# Patient Record
Sex: Female | Born: 1990 | Race: White | Hispanic: No | Marital: Single | Smoking: Never smoker
Health system: Southern US, Community
[De-identification: ages and names within clinical notes are randomized; demographics above are authoritative.]

## PROBLEM LIST (undated history)

## (undated) DIAGNOSIS — T7840XA Allergy, unspecified, initial encounter: Secondary | ICD-10-CM

## (undated) DIAGNOSIS — F32A Depression, unspecified: Secondary | ICD-10-CM

## (undated) DIAGNOSIS — E669 Obesity, unspecified: Secondary | ICD-10-CM

## (undated) DIAGNOSIS — F419 Anxiety disorder, unspecified: Secondary | ICD-10-CM

## (undated) DIAGNOSIS — J189 Pneumonia, unspecified organism: Secondary | ICD-10-CM

## (undated) DIAGNOSIS — I10 Essential (primary) hypertension: Secondary | ICD-10-CM

## (undated) DIAGNOSIS — K219 Gastro-esophageal reflux disease without esophagitis: Secondary | ICD-10-CM

## (undated) DIAGNOSIS — J45909 Unspecified asthma, uncomplicated: Secondary | ICD-10-CM

## (undated) DIAGNOSIS — F329 Major depressive disorder, single episode, unspecified: Secondary | ICD-10-CM

## (undated) HISTORY — DX: Major depressive disorder, single episode, unspecified: F32.9

## (undated) HISTORY — DX: Essential (primary) hypertension: I10

## (undated) HISTORY — DX: Pneumonia, unspecified organism: J18.9

## (undated) HISTORY — DX: Anxiety disorder, unspecified: F41.9

## (undated) HISTORY — PX: EYE SURGERY: SHX253

## (undated) HISTORY — PX: TONSILECTOMY, ADENOIDECTOMY, BILATERAL MYRINGOTOMY AND TUBES: SHX2538

## (undated) HISTORY — DX: Allergy, unspecified, initial encounter: T78.40XA

## (undated) HISTORY — DX: Gastro-esophageal reflux disease without esophagitis: K21.9

## (undated) HISTORY — PX: BREAST SURGERY: SHX581

## (undated) HISTORY — DX: Depression, unspecified: F32.A

---

## 2015-01-16 ENCOUNTER — Other Ambulatory Visit: Payer: Self-pay | Admitting: Family Medicine

## 2015-01-16 ENCOUNTER — Ambulatory Visit (INDEPENDENT_AMBULATORY_CARE_PROVIDER_SITE_OTHER): Payer: Managed Care, Other (non HMO) | Admitting: Family Medicine

## 2015-01-16 ENCOUNTER — Encounter: Payer: Self-pay | Admitting: Family Medicine

## 2015-01-16 VITALS — BP 120/96 | HR 99 | Temp 98.6°F | Resp 18 | Wt 292.0 lb

## 2015-01-16 DIAGNOSIS — E669 Obesity, unspecified: Secondary | ICD-10-CM

## 2015-01-16 DIAGNOSIS — F329 Major depressive disorder, single episode, unspecified: Secondary | ICD-10-CM

## 2015-01-16 DIAGNOSIS — N938 Other specified abnormal uterine and vaginal bleeding: Secondary | ICD-10-CM

## 2015-01-16 DIAGNOSIS — F32A Depression, unspecified: Secondary | ICD-10-CM

## 2015-01-16 LAB — COMPREHENSIVE METABOLIC PANEL
ALK PHOS: 63 U/L (ref 33–115)
ALT: 33 U/L — AB (ref 6–29)
AST: 24 U/L (ref 10–30)
Albumin: 4.2 g/dL (ref 3.6–5.1)
BILIRUBIN TOTAL: 0.3 mg/dL (ref 0.2–1.2)
BUN: 13 mg/dL (ref 7–25)
CALCIUM: 9.3 mg/dL (ref 8.6–10.2)
CO2: 24 mmol/L (ref 20–31)
Chloride: 104 mmol/L (ref 98–110)
Creat: 0.68 mg/dL (ref 0.50–1.10)
GLUCOSE: 97 mg/dL (ref 65–99)
POTASSIUM: 4.2 mmol/L (ref 3.5–5.3)
Sodium: 141 mmol/L (ref 135–146)
TOTAL PROTEIN: 6.7 g/dL (ref 6.1–8.1)

## 2015-01-16 LAB — CBC
HEMATOCRIT: 34.7 % — AB (ref 36.0–46.0)
HEMOGLOBIN: 11.5 g/dL — AB (ref 12.0–15.0)
MCH: 25.5 pg — ABNORMAL LOW (ref 26.0–34.0)
MCHC: 33.1 g/dL (ref 30.0–36.0)
MCV: 76.9 fL — AB (ref 78.0–100.0)
MPV: 9.8 fL (ref 8.6–12.4)
Platelets: 257 10*3/uL (ref 150–400)
RBC: 4.51 MIL/uL (ref 3.87–5.11)
RDW: 15.2 % (ref 11.5–15.5)
WBC: 6 10*3/uL (ref 4.0–10.5)

## 2015-01-16 LAB — TSH: TSH: 5.076 u[IU]/mL — AB (ref 0.350–4.500)

## 2015-01-16 MED ORDER — FLUOXETINE HCL 20 MG PO TABS: 20.0000 mg | ORAL_TABLET | Freq: Every day | ORAL | Status: DC

## 2015-01-16 MED ORDER — NORETHINDRONE-ETH ESTRADIOL 1-35 MG-MCG PO TABS: 1.0000 | ORAL_TABLET | Freq: Every day | ORAL | Status: DC

## 2015-01-16 NOTE — Progress Notes (Signed)
Subjective:    Patient ID: Julie Fernandez, female    DOB: 10-19-1990, 24 y.o.   MRN: [id number]  HPI This is a 24 yo female who presents today to establish care. She is originally from [place] and moved to [place] from [place] 3 months ago.   She has a 5 year history of depression. She thinks it was triggered by her parent's divorce and her grandmother's death. She dropped out of high school to care for her grandmother and they were very close. She currently lives with her mother and step-father. She reports that they are supportive and they get along well. Her baby brother recently moved to college and she misses him. She was on prozac 20 mg in the past, but has been off for a year. She felt that her depression was significantly improved and she would like to go back on at the same dose. She is currently looking for a job, but has not had any success. She is not sure what she would like to do. She enjoys watching Netflix and playing video games. She has many years of poor sleep. She stays awake until 4 am watching Netflix and playing video games. Does not feel rested when she wakes up. Plays guitar. Does not get any regular exercise. She has felt more depressed lately. Does not like living in [place]. She occasionally thinks about suicide but does not have a plan. She has thought about suicide in the past, but never developed a plan. She would not go through with it because it would be hurtful to her family. She denies homicidal ideation.   Has had very long periods, bleeding for up to 5 months. She was previously on OCP which controlled her cycles. Has never been sexually active. Had pap smear 2 years ago, negative. No pain or cramping. Some days bleeding heavy, 10 pads, other days are lighter. She is interested in going back on same OCP she was on before- ortho novum. Periods were manageable when on OCPs. No history of migraine. Does not smoke.   Diagnosed with asthma as a teenager. Not  currently having any asthma symptoms. Last symptoms 3 months ago. Not on medication.   Past Medical History  Diagnosis Date  . Allergy   . Anxiety   . Depression    Past Surgical History  Procedure Laterality Date  . Breast surgery      breast reduction 2011  . Eye surgery      strabismus   No family history on file. Social History  Substance Use Topics  . Smoking status: Never Smoker   . Smokeless tobacco: Never Used  . Alcohol Use: No   Review of Systems Fatigued, no dizziness, no fever, no cough, rare wheeze. No headaches.     Objective:   Physical Exam Physical Exam  Constitutional: Oriented to person, place, and time. Obese.  HENT:  Head: Normocephalic and atraumatic.  Eyes: Conjunctivae are normal.  Neck: Normal range of motion. Neck supple.  Cardiovascular: Normal rate, regular rhythm and normal heart sounds.   Pulmonary/Chest: Effort normal and breath sounds normal.  Musculoskeletal: Normal range of motion.  Neurological: Alert and oriented to person, place, and time.  Skin: Skin is warm and dry.  Psychiatric: Normal mood and affect. Behavior is normal. Judgment and thought content normal.  Vitals reviewed. BP 120/96 mmHg  Pulse 99  Temp(Src) 98.6 F (37 C) (Oral)  Resp 18  Wt 292 lb (132.45 kg)     Assessment & Plan:  1. Depression - had discussion about non pharmacological steps to decrease depression- importance regular routine, and of getting adequate sleep, good sleep hygiene, healthy food choices, exercise at least every other day. - FLUoxetine (PROZAC) 20 MG tablet; Take 1 tablet (20 mg total) by mouth daily.  Dispense: 30 tablet; Refill: 3 - discussed rare increased depression when starting SSRIs. Instructed patient to go to ER or RTC if she starts to feel that her mood is worse.  2. Dysfunctional uterine bleeding - norethindrone-ethinyl estradiol 1/35 (ORTHO-NOVUM, NORTREL,CYCLAFEM) tablet; Take 1 tablet by mouth daily.  Dispense: 1 Package;  Refill: 11 - CBC - Comprehensive metabolic panel - TSH  3. Obesity - Comprehensive metabolic panel - TSH - Hemoglobin A1c  - follow up in 8 weeks, sooner if she has any worsening depressive symptoms or if bleeding has not stopped in 2 weeks.   Olean Ree, FNP-BC  [facility], [facility]  01/16/2015 6:07 PM

## 2015-01-16 NOTE — Patient Instructions (Signed)
Work on establishing a regular sleep/wake schedule Get some exercise every day If your bleeding doesn't improve in 2-3 weeks, please let me know. If you mood gets worse, please come back in to see me.  I will see you back in 8 weeks for a follow up visit.   Insomnia Insomnia is frequent trouble falling and/or staying asleep. Insomnia can be a long term problem or a short term problem. Both are common. Insomnia can be a short term problem when the wakefulness is related to a certain stress or worry. Long term insomnia is often related to ongoing stress during waking hours and/or poor sleeping habits. Overtime, sleep deprivation itself can make the problem worse. Every little thing feels more severe because you are overtired and your ability to cope is decreased. CAUSES   Stress, anxiety, and depression.  Poor sleeping habits.  Distractions such as TV in the bedroom.  Naps close to bedtime.  Engaging in emotionally charged conversations before bed.  Technical reading before sleep.  Alcohol and other sedatives. They may make the problem worse. They can hurt normal sleep patterns and normal dream activity.  Stimulants such as caffeine for several hours prior to bedtime.  Pain syndromes and shortness of breath can cause insomnia.  Exercise late at night.  Changing time zones may cause sleeping problems (jet lag). It is sometimes helpful to have someone observe your sleeping patterns. They should look for periods of not breathing during the night (sleep apnea). They should also look to see how long those periods last. If you live alone or observers are uncertain, you can also be observed at a sleep clinic where your sleep patterns will be professionally monitored. Sleep apnea requires a checkup and treatment. Give your caregivers your medical history. Give your caregivers observations your family has made about your sleep.  SYMPTOMS   Not feeling rested in the morning.  Anxiety and  restlessness at bedtime.  Difficulty falling and staying asleep. TREATMENT   Your caregiver may prescribe treatment for an underlying medical disorders. Your caregiver can give advice or help if you are using alcohol or other drugs for self-medication. Treatment of underlying problems will usually eliminate insomnia problems.  Medications can be prescribed for short time use. They are generally not recommended for lengthy use.  Over-the-counter sleep medicines are not recommended for lengthy use. They can be habit forming.  You can promote easier sleeping by making lifestyle changes such as:  Using relaxation techniques that help with breathing and reduce muscle tension.  Exercising earlier in the day.  Changing your diet and the time of your last meal. No night time snacks.  Establish a regular time to go to bed.  Counseling can help with stressful problems and worry.  Soothing music and white noise may be helpful if there are background noises you cannot remove.  Stop tedious detailed work at least one hour before bedtime. HOME CARE INSTRUCTIONS   Keep a diary. Inform your caregiver about your progress. This includes any medication side effects. See your caregiver regularly. Take note of:  Times when you are asleep.  Times when you are awake during the night.  The quality of your sleep.  How you feel the next day. This information will help your caregiver care for you.  Get out of bed if you are still awake after 15 minutes. Read or do some quiet activity. Keep the lights down. Wait until you feel sleepy and go back to bed.  Keep regular sleeping  and waking hours. Avoid naps.  Exercise regularly.  Avoid distractions at bedtime. Distractions include watching television or engaging in any intense or detailed activity like attempting to balance the household checkbook.  Develop a bedtime ritual. Keep a familiar routine of bathing, brushing your teeth, climbing into bed  at the same time each night, listening to soothing music. Routines increase the success of falling to sleep faster.  Use relaxation techniques. This can be using breathing and muscle tension release routines. It can also include visualizing peaceful scenes. You can also help control troubling or intruding thoughts by keeping your mind occupied with boring or repetitive thoughts like the old concept of counting sheep. You can make it more creative like imagining planting one beautiful flower after another in your backyard garden.  During your day, work to eliminate stress. When this is not possible use some of the previous suggestions to help reduce the anxiety that accompanies stressful situations. MAKE SURE YOU:   Understand these instructions.  Will watch your condition.  Will get help right away if you are not doing well or get worse. Document Released: 05/03/2000 Document Revised: 07/29/2011 Document Reviewed: 06/03/2007 [facility] Patient Information 2015 [place], [place]. This information is not intended to replace advice given to you by your health care provider. Make sure you discuss any questions you have with your health care provider.

## 2015-01-17 LAB — HEMOGLOBIN A1C
Hgb A1c MFr Bld: 5.4 % (ref <5.7)
MEAN PLASMA GLUCOSE: 108 mg/dL (ref <117)

## 2015-01-18 LAB — T4: T4 TOTAL: 8.7 ug/dL (ref 4.5–12.0)

## 2015-01-25 ENCOUNTER — Encounter: Payer: Self-pay | Admitting: Family Medicine

## 2015-03-13 ENCOUNTER — Ambulatory Visit: Payer: Self-pay | Admitting: Family Medicine

## 2015-03-15 ENCOUNTER — Other Ambulatory Visit: Payer: Self-pay

## 2015-03-15 DIAGNOSIS — F329 Major depressive disorder, single episode, unspecified: Secondary | ICD-10-CM

## 2015-03-15 DIAGNOSIS — F32A Depression, unspecified: Secondary | ICD-10-CM

## 2015-03-15 MED ORDER — FLUOXETINE HCL 20 MG PO TABS: 20.0000 mg | ORAL_TABLET | Freq: Every day | ORAL | Status: DC

## 2015-05-22 ENCOUNTER — Ambulatory Visit: Payer: Managed Care, Other (non HMO) | Admitting: Physician Assistant

## 2015-05-30 ENCOUNTER — Ambulatory Visit: Payer: Managed Care, Other (non HMO) | Admitting: Family Medicine

## 2015-07-12 ENCOUNTER — Ambulatory Visit: Payer: Managed Care, Other (non HMO) | Admitting: Urgent Care

## 2015-08-22 ENCOUNTER — Ambulatory Visit (INDEPENDENT_AMBULATORY_CARE_PROVIDER_SITE_OTHER): Payer: 59 | Admitting: Family Medicine

## 2015-08-22 ENCOUNTER — Encounter: Payer: Self-pay | Admitting: Family Medicine

## 2015-08-22 VITALS — BP 129/90 | HR 120 | Temp 98.7°F | Resp 18 | Ht 63.0 in | Wt 289.0 lb

## 2015-08-22 DIAGNOSIS — F329 Major depressive disorder, single episode, unspecified: Secondary | ICD-10-CM | POA: Diagnosis not present

## 2015-08-22 DIAGNOSIS — J309 Allergic rhinitis, unspecified: Secondary | ICD-10-CM

## 2015-08-22 DIAGNOSIS — K219 Gastro-esophageal reflux disease without esophagitis: Secondary | ICD-10-CM

## 2015-08-22 DIAGNOSIS — F32A Depression, unspecified: Secondary | ICD-10-CM

## 2015-08-22 DIAGNOSIS — J4521 Mild intermittent asthma with (acute) exacerbation: Secondary | ICD-10-CM | POA: Diagnosis not present

## 2015-08-22 DIAGNOSIS — N938 Other specified abnormal uterine and vaginal bleeding: Secondary | ICD-10-CM | POA: Diagnosis not present

## 2015-08-22 MED ORDER — NORETHINDRONE-ETH ESTRADIOL 1-35 MG-MCG PO TABS: 1.0000 | ORAL_TABLET | Freq: Every day | ORAL | Status: DC

## 2015-08-22 MED ORDER — PREDNISONE 20 MG PO TABS: ORAL_TABLET | ORAL | Status: DC

## 2015-08-22 MED ORDER — FLUOXETINE HCL 20 MG PO TABS: 20.0000 mg | ORAL_TABLET | Freq: Every day | ORAL | Status: DC

## 2015-08-22 MED ORDER — OMEPRAZOLE 20 MG PO CPDR: 20.0000 mg | DELAYED_RELEASE_CAPSULE | Freq: Every day | ORAL | Status: DC

## 2015-08-22 MED ORDER — ALBUTEROL SULFATE HFA 108 (90 BASE) MCG/ACT IN AERS: 2.0000 | INHALATION_SPRAY | RESPIRATORY_TRACT | Status: DC | PRN

## 2015-08-22 MED ORDER — BECLOMETHASONE DIPROPIONATE 40 MCG/ACT IN AERS: 2.0000 | INHALATION_SPRAY | Freq: Two times a day (BID) | RESPIRATORY_TRACT | Status: DC

## 2015-08-22 NOTE — Progress Notes (Signed)
Subjective:    Patient ID: Julie Fernandez, female    DOB: 21-May-1990, 25 y.o.   MRN: [id number]  HPI This is a 25 yo female who presents today with 2 months of headache, runny nose, ear pain, sore throat. Felt it moving to her chest and noticed wheezing and dry cough. No fever, no chills.  Has been taking Tylenol Severe Sinus with some improvement. Has problems every spring. Clear, watery nasal drainage. History of asthma, can't find inhaler since she moved. Has been on daily medication in the past with good control.  Has run out of fluoexetine and needs refill of OCPs. Mood good on fluoextine. She is looking for a job. Continues to have difficulty sleeping and has excessive daytime sleeping. Is trying to get on more consistent schedule. Menses much shorter and lighter on OCPs.  She has had recent (1 month) onset of acid indigestion. Temporary relief with Tums, no relief with Zantac 1x/day. Has not noticed relationship to foods. Little caffeine intake.    Past Medical History  Diagnosis Date  . Allergy   . Anxiety   . Depression    Past Surgical History  Procedure Laterality Date  . Breast surgery      breast reduction 2011  . Eye surgery      strabismus  No family history on file. Social History  Substance Use Topics  . Smoking status: Never Smoker   . Smokeless tobacco: Never Used  . Alcohol Use: No    Review of Systems  Constitutional: Positive for fatigue. Negative for fever, chills and diaphoresis.  HENT: Positive for congestion, ear pain, postnasal drip, rhinorrhea and sinus pressure.   Respiratory: Positive for cough, shortness of breath (climbing stairs) and wheezing.   Gastrointestinal: Positive for abdominal pain (burning into chest).  Neurological: Positive for headaches.       Objective:   Physical Exam  Constitutional: She is oriented to person, place, and time. She appears well-developed and well-nourished. No distress.  Morbidly obese  HENT:  Head:  Normocephalic and atraumatic.  Right Ear: External ear normal. Tympanic membrane is retracted.  Left Ear: Tympanic membrane, external ear and ear canal normal.  Nose: Mucosal edema and rhinorrhea present.  Mouth/Throat: Uvula is midline and oropharynx is clear and moist.  Eyes: Conjunctivae are normal.  Neck: Normal range of motion. Neck supple.  Cardiovascular: Normal rate and normal heart sounds.   HR on auscultation 104.  Pulmonary/Chest: Effort normal and breath sounds normal.  Lymphadenopathy:    She has no cervical adenopathy.  Neurological: She is alert and oriented to person, place, and time.  Skin: Skin is warm and dry. She is not diaphoretic.  Psychiatric: She has a normal mood and affect. Her behavior is normal. Judgment and thought content normal.  Vitals reviewed.     BP 129/90 mmHg  Pulse 120  Temp(Src) 98.7 F (37.1 C)  Resp 18  Ht 5\' 3"  (1.6 m)  Wt 289 lb (131.09 kg)  BMI 51.21 kg/m2 Wt Readings from Last 3 Encounters:  08/22/15 289 lb (131.09 kg)  01/16/15 292 lb (132.45 kg)   PF 300, predicted 475    Assessment & Plan:  1. Asthmatic bronchitis, mild intermittent, with acute exacerbation - predniSONE (DELTASONE) 20 MG tablet; Take 3 for 3 days, then 2 for 3 days then 1 for 3 days  Dispense: 18 tablet; Refill: 0 - beclomethasone (QVAR) 40 MCG/ACT inhaler; Inhale 2 puffs into the lungs 2 (two) times daily.  Dispense: 1 Inhaler;  Refill: 12 - albuterol (PROVENTIL HFA;VENTOLIN HFA) 108 (90 Base) MCG/ACT inhaler; Inhale 2 puffs into the lungs every 4 (four) hours as needed for wheezing or shortness of breath (cough, shortness of breath or wheezing.).  Dispense: 1 Inhaler; Refill: 1  2. Allergic rhinitis, unspecified allergic rhinitis type - encouraged her to add daily, long acting antihistamine  3. Gastroesophageal reflux disease, esophagitis presence not specified - Provided written and verbal information regarding diagnosis and treatment. - omeprazole  (PRILOSEC) 20 MG capsule; Take 1 capsule (20 mg total) by mouth daily.  Dispense: 30 capsule; Refill: 1  4. Dysfunctional uterine bleeding - norethindrone-ethinyl estradiol 1/35 (ORTHO-NOVUM, NORTREL,CYCLAFEM) tablet; Take 1 tablet by mouth daily.  Dispense: 1 Package; Refill: 11  5. Depression - encouraged adhering to regular, consistent routine, daily exercise, encouraged her to consider counseling- not interested at this time - FLUoxetine (PROZAC) 20 MG tablet; Take 1 tablet (20 mg total) by mouth daily.  Dispense: 90 tablet; Refill: 1  - f/u in 4-6 weeks, sooner if no improvement with treatment  Olean Ree, FNP-BC  [facility], [facility]  08/22/2015 8:53 AM

## 2015-08-22 NOTE — Patient Instructions (Addendum)
Please start prednisone when you wake up today  For muscle aches, headache, sore throat you can take over the counter acetaminophen or ibuprofen as directed on the package  For nasal congestion you can use Afrin nasal spray twice a day for up to 3 days, and /or sudafed, and/or saline nasal spray  For cough you can use Delsym cough syrup  Please come back to see us or go to the emergency department if you are not better in 5 to 7 days or if you develop fever over 101 for more than 48 hours or if you develop wheezing or shortness of breath.     IF you received an x-ray today, you will receive an invoice from [facility]. Please contact [facility] at [phone] with questions or concerns regarding your invoice.   IF you received labwork today, you will receive an invoice from [facility]. Please contact [facility] at [phone] with questions or concerns regarding your invoice.   Our billing staff will not be able to assist you with questions regarding bills from these companies.  You will be contacted with the lab results as soon as they are available. The fastest way to get your results is to activate your My Chart account. Instructions are located on the last page of this paperwork. If you have not heard from us regarding the results in 2 weeks, please contact this office.    Gastroesophageal Reflux Disease, Adult Normally, food travels down the esophagus and stays in the stomach to be digested. However, when a person has gastroesophageal reflux disease (GERD), food and stomach acid move back up into the esophagus. When this happens, the esophagus becomes sore and inflamed. Over time, GERD can create small holes (ulcers) in the lining of the esophagus.  CAUSES This condition is caused by a problem with the muscle between the esophagus and the stomach (lower esophageal sphincter, or LES). Normally, the LES muscle closes after food passes through  the esophagus to the stomach. When the LES is weakened or abnormal, it does not close properly, and that allows food and stomach acid to go back up into the esophagus. The LES can be weakened by certain dietary substances, medicines, and medical conditions, including:  Tobacco use.  Pregnancy.  Having a hiatal hernia.  Heavy alcohol use.  Certain foods and beverages, such as coffee, chocolate, onions, and peppermint. RISK FACTORS This condition is more likely to develop in:  People who have an increased body weight.  People who have connective tissue disorders.  People who use NSAID medicines. SYMPTOMS Symptoms of this condition include:  Heartburn.  Difficult or painful swallowing.  The feeling of having a lump in the throat.  Abitter taste in the mouth.  Bad breath.  Having a large amount of saliva.  Having an upset or bloated stomach.  Belching.  Chest pain.  Shortness of breath or wheezing.  Ongoing (chronic) cough or a night-time cough.  Wearing away of tooth enamel.  Weight loss. Different conditions can cause chest pain. Make sure to see your health care provider if you experience chest pain. DIAGNOSIS Your health care provider will take a medical history and perform a physical exam. To determine if you have mild or severe GERD, your health care provider may also monitor how you respond to treatment. You may also have other tests, including:  An endoscopy toexamine your stomach and esophagus with a small camera.  A test thatmeasures the acidity level in your esophagus.  A  test thatmeasures how much pressure is on your esophagus.  A barium swallow or modified barium swallow to show the shape, size, and functioning of your esophagus. TREATMENT The goal of treatment is to help relieve your symptoms and to prevent complications. Treatment for this condition may vary depending on how severe your symptoms are. Your health care provider may  recommend:  Changes to your diet.  Medicine.  Surgery. HOME CARE INSTRUCTIONS Diet  Follow a diet as recommended by your health care provider. This may involve avoiding foods and drinks such as:  Coffee and tea (with or without caffeine).  Drinks that containalcohol.  Energy drinks and sports drinks.  Carbonated drinks or sodas.  Chocolate and cocoa.  Peppermint and mint flavorings.  Garlic and onions.  Horseradish.  Spicy and acidic foods, including peppers, chili powder, curry powder, vinegar, hot sauces, and barbecue sauce.  Citrus fruit juices and citrus fruits, such as oranges, lemons, and limes.  Tomato-based foods, such as red sauce, chili, salsa, and pizza with red sauce.  Fried and fatty foods, such as donuts, french fries, potato chips, and high-fat dressings.  High-fat meats, such as hot dogs and fatty cuts of red and white meats, such as rib eye steak, sausage, ham, and bacon.  High-fat dairy items, such as whole milk, butter, and cream cheese.  Eat small, frequent meals instead of large meals.  Avoid drinking large amounts of liquid with your meals.  Avoid eating meals during the 2-3 hours before bedtime.  Avoid lying down right after you eat.  Do not exercise right after you eat. General Instructions  Pay attention to any changes in your symptoms.  Take over-the-counter and prescription medicines only as told by your health care provider. Do not take aspirin, ibuprofen, or other NSAIDs unless your health care provider told you to do so.  Do not use any tobacco products, including cigarettes, chewing tobacco, and e-cigarettes. If you need help quitting, ask your health care provider.  Wear loose-fitting clothing. Do not wear anything tight around your waist that causes pressure on your abdomen.  Raise (elevate) the head of your bed 6 inches (15cm).  Try to reduce your stress, such as with yoga or meditation. If you need help reducing  stress, ask your health care provider.  If you are overweight, reduce your weight to an amount that is healthy for you. Ask your health care provider for guidance about a safe weight loss goal.  Keep all follow-up visits as told by your health care provider. This is important. SEEK MEDICAL CARE IF:  You have new symptoms.  You have unexplained weight loss.  You have difficulty swallowing, or it hurts to swallow.  You have wheezing or a persistent cough.  Your symptoms do not improve with treatment.  You have a hoarse voice. SEEK IMMEDIATE MEDICAL CARE IF:  You have pain in your arms, neck, jaw, teeth, or back.  You feel sweaty, dizzy, or light-headed.  You have chest pain or shortness of breath.  You vomit and your vomit looks like blood or coffee grounds.  You faint.  Your stool is bloody or black.  You cannot swallow, drink, or eat.   This information is not intended to replace advice given to you by your health care provider. Make sure you discuss any questions you have with your health care provider.   Document Released: 02/13/2005 Document Revised: 01/25/2015 Document Reviewed: 08/31/2014 Elsevier Interactive Patient Education [employer]

## 2015-09-12 ENCOUNTER — Emergency Department (HOSPITAL_COMMUNITY): Payer: 59

## 2015-09-12 ENCOUNTER — Encounter (HOSPITAL_COMMUNITY): Payer: Self-pay | Admitting: Emergency Medicine

## 2015-09-12 DIAGNOSIS — Z793 Long term (current) use of hormonal contraceptives: Secondary | ICD-10-CM | POA: Diagnosis not present

## 2015-09-12 DIAGNOSIS — Z7951 Long term (current) use of inhaled steroids: Secondary | ICD-10-CM | POA: Diagnosis not present

## 2015-09-12 DIAGNOSIS — Y92009 Unspecified place in unspecified non-institutional (private) residence as the place of occurrence of the external cause: Secondary | ICD-10-CM | POA: Diagnosis not present

## 2015-09-12 DIAGNOSIS — Y9389 Activity, other specified: Secondary | ICD-10-CM | POA: Insufficient documentation

## 2015-09-12 DIAGNOSIS — Z79899 Other long term (current) drug therapy: Secondary | ICD-10-CM | POA: Diagnosis not present

## 2015-09-12 DIAGNOSIS — Y998 Other external cause status: Secondary | ICD-10-CM | POA: Insufficient documentation

## 2015-09-12 DIAGNOSIS — F419 Anxiety disorder, unspecified: Secondary | ICD-10-CM | POA: Insufficient documentation

## 2015-09-12 DIAGNOSIS — S99912A Unspecified injury of left ankle, initial encounter: Secondary | ICD-10-CM | POA: Insufficient documentation

## 2015-09-12 DIAGNOSIS — E669 Obesity, unspecified: Secondary | ICD-10-CM | POA: Diagnosis not present

## 2015-09-12 DIAGNOSIS — W108XXA Fall (on) (from) other stairs and steps, initial encounter: Secondary | ICD-10-CM | POA: Insufficient documentation

## 2015-09-12 DIAGNOSIS — F329 Major depressive disorder, single episode, unspecified: Secondary | ICD-10-CM | POA: Insufficient documentation

## 2015-09-12 NOTE — ED Notes (Signed)
Pt. slipped and fell at home this evening , reports pain at left ankle with mild swelling .

## 2015-09-13 ENCOUNTER — Emergency Department (HOSPITAL_COMMUNITY)
Admission: EM | Admit: 2015-09-13 | Discharge: 2015-09-13 | Disposition: A | Discharge status: Home / Self Care | Payer: 59 | Attending: Emergency Medicine | Admitting: Emergency Medicine

## 2015-09-13 DIAGNOSIS — M25572 Pain in left ankle and joints of left foot: Secondary | ICD-10-CM

## 2015-09-13 DIAGNOSIS — W19XXXA Unspecified fall, initial encounter: Secondary | ICD-10-CM

## 2015-09-13 HISTORY — DX: Obesity, unspecified: E66.9

## 2015-09-13 MED ORDER — IBUPROFEN 400 MG PO TABS
800.0000 mg | ORAL_TABLET | Freq: Once | ORAL | Status: AC
  Administered 2015-09-13: 800 mg via ORAL
  Filled 2015-09-13: qty 2

## 2015-09-13 MED ORDER — IBUPROFEN 800 MG PO TABS: 800.0000 mg | ORAL_TABLET | Freq: Three times a day (TID) | ORAL | Status: DC

## 2015-09-13 NOTE — ED Notes (Signed)
Patient able to ambulate independently  

## 2015-09-13 NOTE — ED Provider Notes (Signed)
CSN: [id number]     Arrival date & time 09/12/15  2306 History   First MD Initiated Contact with Patient 09/13/15 0021     Chief Complaint  Patient presents with  . Ankle Injury     (Consider location/radiation/quality/duration/timing/severity/associated sxs/prior Treatment) HPI   Patient is a 25 year old female with past medical history of anxiety, depression and obesity presents to the ED status post fall that occurred prior to arrival with complaint of left ankle pain and swelling. Patient reports she slipped and fell down stairs at home resulting in her landing on her left ankle. Patient denies head injury or LOC. Patient denies taking any medications or using ice prior to arrival. Denies numbness, tingling, weakness.  Past Medical History  Diagnosis Date  . Allergy   . Anxiety   . Depression   . Obesity    Past Surgical History  Procedure Laterality Date  . Breast surgery      breast reduction 2011  . Eye surgery      strabismus   No family history on file. Social History  Substance Use Topics  . Smoking status: Never Smoker   . Smokeless tobacco: Never Used  . Alcohol Use: No   OB History    No data available     Review of Systems  Constitutional: Negative for fever.  Musculoskeletal: Positive for joint swelling (left ankle) and arthralgias (left ankle).  Skin: Negative for wound.  Neurological: Negative for numbness.      Allergies  Review of patient's allergies indicates no known allergies.  Home Medications   Prior to Admission medications   Medication Sig Start Date End Date Taking? Authorizing Provider  albuterol (PROVENTIL HFA;VENTOLIN HFA) 108 (90 Base) MCG/ACT inhaler Inhale 2 puffs into the lungs every 4 (four) hours as needed for wheezing or shortness of breath (cough, shortness of breath or wheezing.). 08/22/15   Emi Belfasteborah B Gessner, FNP  beclomethasone (QVAR) 40 MCG/ACT inhaler Inhale 2 puffs into the lungs 2 (two) times daily. 08/22/15   Emi Belfasteborah B  Gessner, FNP  FLUoxetine (PROZAC) 20 MG tablet Take 1 tablet (20 mg total) by mouth daily. 08/22/15   Emi Belfasteborah B Gessner, FNP  ibuprofen (ADVIL,MOTRIN) 800 MG tablet Take 1 tablet (800 mg total) by mouth 3 (three) times daily. 09/13/15   Barrett HenleNicole Elizabeth Nadeau, PA-C  norethindrone-ethinyl estradiol 1/35 (ORTHO-NOVUM, NORTREL,CYCLAFEM) tablet Take 1 tablet by mouth daily. 08/22/15   Emi Belfasteborah B Gessner, FNP  omeprazole (PRILOSEC) 20 MG capsule Take 1 capsule (20 mg total) by mouth daily. 08/22/15   Emi Belfasteborah B Gessner, FNP  predniSONE (DELTASONE) 20 MG tablet Take 3 for 3 days, then 2 for 3 days then 1 for 3 days 08/22/15   Emi Belfasteborah B Gessner, FNP   BP 132/93 mmHg  Pulse 90  Temp(Src) 98.6 F (37 C) (Oral)  Resp 18  Ht 5\' 3"  (1.6 m)  Wt 133.358 kg  BMI 52.09 kg/m2  SpO2 98% Physical Exam  Constitutional: She is oriented to person, place, and time. She appears well-developed and well-nourished.  HENT:  Head: Normocephalic and atraumatic.  Eyes: Conjunctivae and EOM are normal. Right eye exhibits no discharge. Left eye exhibits no discharge. No scleral icterus.  Neck: Normal range of motion. Neck supple.  Pulmonary/Chest: Effort normal.  Musculoskeletal:       Left ankle: She exhibits swelling. She exhibits normal range of motion, no ecchymosis, no deformity, no laceration and normal pulse. Tenderness. Lateral malleolus tenderness found. Achilles tendon normal.  Moderate swelling noted to  left lateral malleolus. Full range of motion of left foot ankle and knee. 5 out of 5 strength. Sensation grossly intact. 2+ DP pulses. Cap refill less than 2.  Neurological: She is alert and oriented to person, place, and time.  Nursing note and vitals reviewed.   ED Course  Procedures (including critical care time) Labs Review Labs Reviewed - No data to display  Imaging Review Dg Ankle Complete Left  09/13/2015  CLINICAL DATA:  Left ankle pain and swelling after fall down 6 steps. Pain and swelling laterally.  EXAM: LEFT ANKLE COMPLETE - 3+ VIEW COMPARISON:  None. FINDINGS: No fracture or dislocation. The alignment and joint spaces are maintained. The ankle mortise is preserved. Base of the fifth metatarsal is intact. No evidence joint effusion. Mild soft tissue edema laterally. IMPRESSION: Soft tissue edema.  No fracture or dislocation. Electronically Signed   By: Rubye Oaks M.D.   On: 09/13/2015 00:04   I have personally reviewed and evaluated these images and lab results as part of my medical decision-making.   EKG Interpretation None      MDM   Final diagnoses:  Fall, initial encounter  Left ankle pain    Patient presents status post mechanical fall that occurred prior to arrival with reported left ankle pain and swelling. Denies head injury or LOC. HR 112, BP 150/116, remaining vital stable. Exam revealed moderate swelling noted to left lateral malleolus with tenderness to palpation, range of motion of left foot ankle and knee, left lower extremity otherwise neurovascularly intact. Patient given ibuprofen in the ED. Left ankle x-ray revealed soft tissue edema, no fracture or dislocation. Splint applied to patient's ankle in the ED. Patient refuses crutches in the ED and states she has crutches at home. Reevaluation of patient's vitals revealed heart rate 90, BP 132/93. Discussed results and plan for discharge with patient. Patient discharged home with NSAIDs, RICE protocol and symptomatic treatment. Patient given follow-up information for orthopedics and advised to follow-up as needed.    Satira Sark [place], [place] 09/13/15 0151  Derwood Kaplan, MD 09/14/15 [phone]

## 2015-09-13 NOTE — Discharge Instructions (Signed)
Take your medications as prescribed as needed for pain relief. I recommend eating prior to taking ibuprofen to prevent gastrointestinal side effects. I recommend resting, elevating and applying ice to her left ankle for 15-20 minutes 3-4 times daily to help with pain and swelling. You may continue using the Ace wrap as needed for pain relief and to help with swelling. Follow up with the orthopedic office listed above her symptoms have not improved over the next 1-2 weeks. Please return to the Emergency Department if symptoms worsen or new onset of fever, redness, swelling, drainage, numbness, tingling, weakness.

## 2015-10-22 ENCOUNTER — Other Ambulatory Visit: Payer: Self-pay | Admitting: Family Medicine

## 2015-12-29 ENCOUNTER — Ambulatory Visit: Payer: 59

## 2016-02-13 ENCOUNTER — Ambulatory Visit (INDEPENDENT_AMBULATORY_CARE_PROVIDER_SITE_OTHER): Payer: 59 | Admitting: Physician Assistant

## 2016-02-13 VITALS — BP 128/106 | HR 116 | Temp 98.6°F | Resp 18 | Ht 63.0 in | Wt 283.0 lb

## 2016-02-13 DIAGNOSIS — J4530 Mild persistent asthma, uncomplicated: Secondary | ICD-10-CM

## 2016-02-13 DIAGNOSIS — R05 Cough: Secondary | ICD-10-CM | POA: Diagnosis not present

## 2016-02-13 DIAGNOSIS — Z91048 Other nonmedicinal substance allergy status: Secondary | ICD-10-CM

## 2016-02-13 DIAGNOSIS — IMO0001 Reserved for inherently not codable concepts without codable children: Secondary | ICD-10-CM

## 2016-02-13 DIAGNOSIS — R030 Elevated blood-pressure reading, without diagnosis of hypertension: Secondary | ICD-10-CM

## 2016-02-13 DIAGNOSIS — R059 Cough, unspecified: Secondary | ICD-10-CM

## 2016-02-13 DIAGNOSIS — J0181 Other acute recurrent sinusitis: Secondary | ICD-10-CM | POA: Diagnosis not present

## 2016-02-13 DIAGNOSIS — J45909 Unspecified asthma, uncomplicated: Secondary | ICD-10-CM | POA: Insufficient documentation

## 2016-02-13 DIAGNOSIS — Z9109 Other allergy status, other than to drugs and biological substances: Secondary | ICD-10-CM | POA: Insufficient documentation

## 2016-02-13 MED ORDER — GUAIFENESIN ER 1200 MG PO TB12: 1.0000 | ORAL_TABLET | Freq: Two times a day (BID) | ORAL | 1 refills | Status: DC | PRN

## 2016-02-13 MED ORDER — FLUTICASONE PROPIONATE 50 MCG/ACT NA SUSP: 2.0000 | Freq: Every day | NASAL | 12 refills | Status: DC

## 2016-02-13 MED ORDER — AMOXICILLIN 500 MG PO CAPS
500.0000 mg | ORAL_CAPSULE | Freq: Two times a day (BID) | ORAL | 0 refills | Status: DC
Start: 2016-02-13 — End: 2016-08-06

## 2016-02-13 NOTE — Progress Notes (Signed)
Julie Fernandez  MRN: [id number] DOB: [birth year]  PCP: Emi Belfasteborah B Gessner, FNP  Subjective:  Pt is a [age] year old female, history of depression, obesity, asthma and allergies, who presents to clinic for sinus pressure. Reports being congested with sinus pressure, headaches, ear pain (both ears), sneezing, and her "lymph nodes are hurting" x three weeks. Notes decreased hearing in both ears for the past week.  She has allergies daily. Tried mucinex two weeks ago, didn't help.  Notes fevers, chills earlier this week. Reports history of sinus infections   Asthma - QVAR and Albuterol. QVAR 2 puffs/day. Albuterol every few months.  Diagnosed with Asthmatic bronchitis in March 2017. [facility] with [facility].   Allergies - Singular daily. Not helping. Has seen allergist when she was 25 years old. Is allergic to cats, dogs, mold and behaya grass.   Lives in house with mold problem in basement.  Nasal steroids don't work. Used them when she was 25 years old living in [place].  Doesn't like putting medications up her nose.   Review of Systems  Constitutional: Positive for chills and fever.  HENT: Positive for congestion, ear pain, hearing loss, postnasal drip, rhinorrhea, sinus pressure and sneezing.   Respiratory: Positive for cough. Negative for chest tightness, shortness of breath and wheezing.   Cardiovascular: Negative.   Gastrointestinal: Negative.     There are no active problems to display for this patient.   Current Outpatient Prescriptions on File Prior to Visit  Medication Sig Dispense Refill  . albuterol (PROVENTIL HFA;VENTOLIN HFA) 108 (90 Base) MCG/ACT inhaler Inhale 2 puffs into the lungs every 4 (four) hours as needed for wheezing or shortness of breath (cough, shortness of breath or wheezing.). 1 Inhaler 1  . beclomethasone (QVAR) 40 MCG/ACT inhaler Inhale 2 puffs into the lungs 2 (two) times daily. 1 Inhaler 12  . FLUoxetine (PROZAC) 20 MG tablet Take 1 tablet (20 mg  total) by mouth daily. 90 tablet 1  . ibuprofen (ADVIL,MOTRIN) 800 MG tablet Take 1 tablet (800 mg total) by mouth 3 (three) times daily. 21 tablet 0  . norethindrone-ethinyl estradiol 1/35 (ORTHO-NOVUM, NORTREL,CYCLAFEM) tablet Take 1 tablet by mouth daily. 1 Package 11  . omeprazole (PRILOSEC) 20 MG capsule TAKE 1 CAPSULE (20 MG TOTAL) BY MOUTH DAILY. 30 capsule 0   No current facility-administered medications on file prior to visit.     No Known Allergies  Objective:  BP (!) 128/106 (BP Location: Right Arm, Patient Position: Sitting, Cuff Size: Large)   Pulse (!) 116   Temp 98.6 F (37 C) (Oral)   Resp 18   Ht 5\' 3"  (1.6 m)   Wt 283 lb (128.4 kg)   LMP 02/06/2016   SpO2 98%   BMI 50.13 kg/m   Physical Exam  Constitutional: She is oriented to person, place, and time and well-developed, well-nourished, and in no distress. No distress.  HENT:  Head: Normocephalic and atraumatic.  Right Ear: Hearing and ear canal normal. No tenderness. Tympanic membrane is retracted. Tympanic membrane is not injected, not erythematous and not bulging. No middle ear effusion.  Left Ear: Hearing and ear canal normal. No tenderness. Tympanic membrane is retracted. Tympanic membrane is not injected, not erythematous and not bulging.  No middle ear effusion.  Nose: Mucosal edema present. No rhinorrhea. Right sinus exhibits maxillary sinus tenderness. Right sinus exhibits no frontal sinus tenderness. Left sinus exhibits maxillary sinus tenderness. Left sinus exhibits no frontal sinus tenderness.  Mouth/Throat: Uvula is midline  and mucous membranes are normal. Posterior oropharyngeal edema and posterior oropharyngeal erythema present. No oropharyngeal exudate.  Eyes: Conjunctivae are normal. Pupils are equal, round, and reactive to light.  Cardiovascular: Normal rate, regular rhythm and normal heart sounds.   Neurological: She is alert and oriented to person, place, and time. GCS score is 15.  Skin: Skin is  warm and dry.  Psychiatric: Mood, memory, affect and judgment normal.  Vitals reviewed.   Assessment and Plan :  1. Asthma, chronic, mild persistent, uncomplicated 2. Environmental allergies 3. Other acute recurrent sinusitis 4. Cough - fluticasone (FLONASE) 50 MCG/ACT nasal spray; Place 2 sprays into both nostrils daily.  Dispense: 16 g; Refill: 12 - amoxicillin (AMOXIL) 500 MG capsule; Take 1 capsule (500 mg total) by mouth 2 (two) times daily.  Dispense: 20 capsule; Refill: 0 - Guaifenesin (MUCINEX MAXIMUM STRENGTH) 1200 MG TB12; Take 1 tablet (1,200 mg total) by mouth every 12 (twelve) hours as needed.  Dispense: 14 tablet; Refill: 1 - Patient encouraged to keep regular daily regimen of allergy medication.  5. Elevated Blood Pressure - Patient encouraged to return to clinic for blood pressure check. Her mother checks her blood pressure at home. She is encouraged to log her readings and bring them with her.   Marco Collie, PA-C  [facility] 02/13/2016 3:44 PM

## 2016-02-13 NOTE — Patient Instructions (Addendum)
[email or web address] to get a coupon.  Please come back in one month to assess your blood pressure. Please bring in a log of your home readings.  Thank you for coming in today. I hope you feel we met your needs.  Feel free to call [facility] if you have any questions or further requests.  Please consider signing up for MyChart if you do not already have it, as this is a great way to communicate with me.  Best,  Whitney McVey, PA-C  IF you received an x-ray today, you will receive an invoice from [facility]. Please contact [facility] at [phone] with questions or concerns regarding your invoice.   IF you received labwork today, you will receive an invoice from [employer]. Please contact [facility] at [phone] with questions or concerns regarding your invoice.   Our billing staff will not be able to assist you with questions regarding bills from these companies.  You will be contacted with the lab results as soon as they are available. The fastest way to get your results is to activate your My Chart account. Instructions are located on the last page of this paperwork. If you have not heard from [place] regarding the results in 2 weeks, please contact this office.

## 2016-02-29 ENCOUNTER — Telehealth: Payer: Self-pay

## 2016-02-29 DIAGNOSIS — N938 Other specified abnormal uterine and vaginal bleeding: Secondary | ICD-10-CM

## 2016-02-29 MED ORDER — NORETHINDRONE-ETH ESTRADIOL 1-35 MG-MCG PO TABS: 1.0000 | ORAL_TABLET | Freq: Every day | ORAL | 4 refills | Status: DC

## 2016-02-29 NOTE — Telephone Encounter (Signed)
rx was written for a year so I changed to 3 months at a time for a year.

## 2016-02-29 NOTE — Telephone Encounter (Signed)
THIS MESSAGE WAS RECEIVED FROM [facility] ON [address]: THEY ARE REQUESTING PATIENT PRESCRIPTION FOR ALYACEN WRITTEN BY DEBBIE GESSNER ON April. 4, 2017 BE CHANGED FROM 30 DAYS TO 90 DAYS (3 MONTH SUPPLY). [facility] PHONE ([place]) [phone]) [phone].  MBC

## 2016-03-13 ENCOUNTER — Other Ambulatory Visit: Payer: Self-pay

## 2016-03-13 MED ORDER — FLUOXETINE HCL 20 MG PO TABS: 20.0000 mg | ORAL_TABLET | Freq: Every day | ORAL | 0 refills | Status: DC

## 2016-03-13 NOTE — Telephone Encounter (Signed)
Last refill and last OV 08/22/15. Ok to refill?

## 2016-04-15 ENCOUNTER — Other Ambulatory Visit: Payer: Self-pay | Admitting: Family Medicine

## 2016-04-23 ENCOUNTER — Other Ambulatory Visit: Payer: Self-pay

## 2016-04-23 MED ORDER — OMEPRAZOLE 20 MG PO CPDR: DELAYED_RELEASE_CAPSULE | ORAL | 0 refills | Status: DC

## 2016-04-23 MED ORDER — FLUOXETINE HCL 20 MG PO TABS: 20.0000 mg | ORAL_TABLET | Freq: Every day | ORAL | 0 refills | Status: DC

## 2016-05-31 ENCOUNTER — Other Ambulatory Visit: Payer: Self-pay | Admitting: Family Medicine

## 2016-06-20 ENCOUNTER — Other Ambulatory Visit: Payer: Self-pay | Admitting: Family Medicine

## 2016-06-22 NOTE — Telephone Encounter (Signed)
Last ov 08/2015 needs ov

## 2016-06-28 ENCOUNTER — Ambulatory Visit: Payer: 59

## 2016-07-02 ENCOUNTER — Ambulatory Visit: Payer: 59

## 2016-08-06 ENCOUNTER — Other Ambulatory Visit: Payer: Self-pay | Admitting: Physician Assistant

## 2016-08-06 ENCOUNTER — Ambulatory Visit (INDEPENDENT_AMBULATORY_CARE_PROVIDER_SITE_OTHER): Payer: 59 | Admitting: Physician Assistant

## 2016-08-06 VITALS — BP 104/60 | HR 102 | Temp 98.2°F | Resp 16 | Ht 63.0 in | Wt 289.0 lb

## 2016-08-06 DIAGNOSIS — J0181 Other acute recurrent sinusitis: Secondary | ICD-10-CM

## 2016-08-06 DIAGNOSIS — R059 Cough, unspecified: Secondary | ICD-10-CM

## 2016-08-06 DIAGNOSIS — F339 Major depressive disorder, recurrent, unspecified: Secondary | ICD-10-CM

## 2016-08-06 DIAGNOSIS — R05 Cough: Secondary | ICD-10-CM | POA: Diagnosis not present

## 2016-08-06 MED ORDER — AMOXICILLIN 500 MG PO CAPS: 500.0000 mg | ORAL_CAPSULE | Freq: Two times a day (BID) | ORAL | 0 refills | Status: DC

## 2016-08-06 MED ORDER — GUAIFENESIN ER 1200 MG PO TB12: 1.0000 | ORAL_TABLET | Freq: Two times a day (BID) | ORAL | 1 refills | Status: DC | PRN

## 2016-08-06 MED ORDER — FLUOXETINE HCL 40 MG PO CAPS: 40.0000 mg | ORAL_CAPSULE | Freq: Every day | ORAL | 3 refills | Status: DC

## 2016-08-06 NOTE — Progress Notes (Signed)
Julie MessingCourtney Phaneuf  MRN: [id number] DOB: 1990/12/28  PCP: Madelaine BhatElizabeth Whitney Yoland Scherr, PA-C  Subjective:  Pt is a 26 year old female PMH asthma and allergies who presents to clinic for sinus pressure x 3 months.  Endorses runny nose, sinus pain and pressure, ear pain.  She has been taking flonase, OTC meds - not helping.  She is not using her Albuterol.  Denies fever, chills, n/v, sore throat, body aches.    H/o Depression - she feels her prozac 20mg  is not working as well as it was. She has been on this medication x 2 years. Denies SI and HI    Review of Systems  Constitutional: Positive for chills. Negative for diaphoresis, fatigue and fever.  HENT: Positive for congestion, ear pain, postnasal drip, sinus pain and sinus pressure. Negative for ear discharge, rhinorrhea, sneezing and sore throat.   Respiratory: Negative for cough, chest tightness, shortness of breath and wheezing.   Cardiovascular: Negative for chest pain and palpitations.  Gastrointestinal: Negative for abdominal pain, diarrhea, nausea and vomiting.  Neurological: Negative for weakness, light-headedness and headaches.  Psychiatric/Behavioral: Positive for dysphoric mood and sleep disturbance.    Patient Active Problem List   Diagnosis Date Noted  . Asthma, chronic 02/13/2016  . Environmental allergies 02/13/2016    Current Outpatient Prescriptions on File Prior to Visit  Medication Sig Dispense Refill  . albuterol (PROVENTIL HFA;VENTOLIN HFA) 108 (90 Base) MCG/ACT inhaler Inhale 2 puffs into the lungs every 4 (four) hours as needed for wheezing or shortness of breath (cough, shortness of breath or wheezing.). 1 Inhaler 1  . beclomethasone (QVAR) 40 MCG/ACT inhaler Inhale 2 puffs into the lungs 2 (two) times daily. 1 Inhaler 12  . FLUoxetine (PROZAC) 20 MG tablet TAKE 1 TABLET (20 MG TOTAL) BY MOUTH DAILY. 30 tablet 0  . fluticasone (FLONASE) 50 MCG/ACT nasal spray Place 2 sprays into both nostrils daily. 16 g 12  .  norethindrone-ethinyl estradiol 1/35 (ORTHO-NOVUM, NORTREL,CYCLAFEM) tablet Take 1 tablet by mouth daily. 3 Package 4  . omeprazole (PRILOSEC) 20 MG capsule TAKE 1 CAPSULE (20 MG TOTAL) BY MOUTH DAILY. 30 capsule 0   No current facility-administered medications on file prior to visit.     No Known Allergies   Objective:  BP 104/60   Pulse (!) 102   Temp 98.2 F (36.8 C) (Other (Comment))   Resp 16   Ht 5\' 3"  (1.6 m)   Wt 289 lb (131.1 kg)   SpO2 98%   BMI 51.19 kg/m   Physical Exam  Constitutional: She is oriented to person, place, and time and well-developed, well-nourished, and in no distress. No distress.  obese  HENT:  Right Ear: Tympanic membrane normal.  Left Ear: Tympanic membrane normal.  Nose: Right sinus exhibits maxillary sinus tenderness. Right sinus exhibits no frontal sinus tenderness. Left sinus exhibits maxillary sinus tenderness. Left sinus exhibits no frontal sinus tenderness.  Mouth/Throat: Oropharynx is clear and moist and mucous membranes are normal.  Cardiovascular: Normal rate, regular rhythm and normal heart sounds.   Neurological: She is alert and oriented to person, place, and time. GCS score is 15.  Skin: Skin is warm and dry.  Psychiatric: Mood, memory and judgment normal. She has a flat affect.  Vitals reviewed.   Assessment and Plan :  1. Other acute recurrent sinusitis 2. Cough - amoxicillin (AMOXIL) 500 MG capsule; Take 1 capsule (500 mg total) by mouth 2 (two) times daily.  Dispense: 20 capsule; Refill: 0 - Guaifenesin (MUCINEX  MAXIMUM STRENGTH) 1200 MG TB12; Take 1 tablet (1,200 mg total) by mouth every 12 (twelve) hours as needed.  Dispense: 14 tablet; Refill: 1 - Supportive care: push fluids and rest. RTC in 5-7 days if no improvement. Consider referral to ENT if symptoms persist.  3. Recurrent major depressive disorder, remission status unspecified (HCC) - FLUoxetine (PROZAC) 40 MG capsule; Take 1 capsule (40 mg total) by mouth daily.   Dispense: 90 capsule; Refill: 3 - Pt is on lowest dose Prozac. Will increase to to 40mg  qd. Encouraged exercise and increased activity in personal interests. RTC in 4-6 weeks for f/u.   Marco Collie, PA-C  [facility] 08/06/2016 4:08 PM

## 2016-08-06 NOTE — Patient Instructions (Addendum)
Increase your Prozac to 36m/day.   Thank you for coming in today. I hope you feel we met your needs.  Feel free to call [facility] if you have any questions or further requests.  Please consider signing up for MyChart if you do not already have it, as this is a great way to communicate with me.  Best,  Whitney McVey, PA-C   DASH Eating Plan DASH stands for "Dietary Approaches to Stop Hypertension." The DASH eating plan is a healthy eating plan that has been shown to reduce high blood pressure (hypertension). It may also reduce your risk for type 2 diabetes, heart disease, and stroke. The DASH eating plan may also help with weight loss. What are tips for following this plan? General guidelines   Avoid eating more than 2,300 mg (milligrams) of salt (sodium) a day. If you have hypertension, you may need to reduce your sodium intake to 1,500 mg a day.  Limit alcohol intake to no more than 1 drink a day for nonpregnant women and 2 drinks a day for men. One drink equals 12 oz of beer, 5 oz of wine, or 1 oz of hard liquor.  Work with your health care provider to maintain a healthy body weight or to lose weight. Ask what an ideal weight is for you.  Get at least 30 minutes of exercise that causes your heart to beat faster (aerobic exercise) most days of the week. Activities may include walking, swimming, or biking.  Work with your health care provider or diet and nutrition specialist (dietitian) to adjust your eating plan to your individual calorie needs. Reading food labels   Check food labels for the amount of sodium per serving. Choose foods with less than 5 percent of the Daily Value of sodium. Generally, foods with less than 300 mg of sodium per serving fit into this eating plan.  To find whole grains, look for the word "whole" as the first word in the ingredient list. Shopping   Buy products labeled as "low-sodium" or "no salt added."  Buy fresh foods. Avoid canned foods and premade or  frozen meals. Cooking   Avoid adding salt when cooking. Use salt-free seasonings or herbs instead of table salt or sea salt. Check with your health care provider or pharmacist before using salt substitutes.  Do not fry foods. Cook foods using healthy methods such as baking, boiling, grilling, and broiling instead.  Cook with heart-healthy oils, such as olive, canola, soybean, or sunflower oil. Meal planning    Eat a balanced diet that includes:  5 or more servings of fruits and vegetables each day. At each meal, try to fill half of your plate with fruits and vegetables.  Up to 6-8 servings of whole grains each day.  Less than 6 oz of lean meat, poultry, or fish each day. A 3-oz serving of meat is about the same size as a deck of cards. One egg equals 1 oz.  2 servings of low-fat dairy each day.  A serving of nuts, seeds, or beans 5 times each week.  Heart-healthy fats. Healthy fats called Omega-3 fatty acids are found in foods such as flaxseeds and coldwater fish, like sardines, salmon, and mackerel.  Limit how much you eat of the following:  Canned or prepackaged foods.  Food that is high in trans fat, such as fried foods.  Food that is high in saturated fat, such as fatty meat.  Sweets, desserts, sugary drinks, and other foods with added sugar.  Full-fat dairy products.  Do not salt foods before eating.  Try to eat at least 2 vegetarian meals each week.  Eat more home-cooked food and less restaurant, buffet, and fast food.  When eating at a restaurant, ask that your food be prepared with less salt or no salt, if possible. What foods are recommended? The items listed may not be a complete list. Talk with your dietitian about what dietary choices are best for you. Grains  Whole-grain or whole-wheat bread. Whole-grain or whole-wheat pasta. Brown rice. [place]. Bulgur. Whole-grain and low-sodium cereals. Pita bread. Low-fat, low-sodium crackers. Whole-wheat flour  tortillas. Vegetables  Fresh or frozen vegetables (raw, steamed, roasted, or grilled). Low-sodium or reduced-sodium tomato and vegetable juice. Low-sodium or reduced-sodium tomato sauce and tomato paste. Low-sodium or reduced-sodium canned vegetables. Fruits  All fresh, dried, or frozen fruit. Canned fruit in natural juice (without added sugar). Meat and other protein foods  Skinless chicken or [place]. Ground chicken or [place]. Pork with fat trimmed off. Fish and seafood. Egg whites. Dried beans, peas, or lentils. Unsalted nuts, nut butters, and seeds. Unsalted canned beans. Lean cuts of beef with fat trimmed off. Low-sodium, lean deli meat. Dairy  Low-fat (1%) or fat-free (skim) milk. Fat-free, low-fat, or reduced-fat cheeses. Nonfat, low-sodium ricotta or cottage cheese. Low-fat or nonfat yogurt. Low-fat, low-sodium cheese. Fats and oils  Soft margarine without trans fats. Vegetable oil. Low-fat, reduced-fat, or light mayonnaise and salad dressings (reduced-sodium). Canola, safflower, olive, soybean, and sunflower oils. Avocado. Seasoning and other foods  Herbs. Spices. Seasoning mixes without salt. Unsalted popcorn and pretzels. Fat-free sweets. What foods are not recommended? The items listed may not be a complete list. Talk with your dietitian about what dietary choices are best for you. Grains  Baked goods made with fat, such as croissants, muffins, or some breads. Dry pasta or rice meal packs. Vegetables  Creamed or fried vegetables. Vegetables in a cheese sauce. Regular canned vegetables (not low-sodium or reduced-sodium). Regular canned tomato sauce and paste (not low-sodium or reduced-sodium). Regular tomato and vegetable juice (not low-sodium or reduced-sodium). Angie Fava. Olives. Fruits  Canned fruit in a light or heavy syrup. Fried fruit. Fruit in cream or butter sauce. Meat and other protein foods  Fatty cuts of meat. Ribs. Fried meat. Berniece Salines. Sausage. Bologna and other processed  lunch meats. Salami. Fatback. Hotdogs. Bratwurst. Salted nuts and seeds. Canned beans with added salt. Canned or smoked fish. Whole eggs or egg yolks. Chicken or [place] with skin. Dairy  Whole or 2% milk, cream, and half-and-half. Whole or full-fat cream cheese. Whole-fat or sweetened yogurt. Full-fat cheese. Nondairy creamers. Whipped toppings. Processed cheese and cheese spreads. Fats and oils  Butter. Stick margarine. Lard. Shortening. Ghee. Bacon fat. Tropical oils, such as coconut, palm kernel, or palm oil. Seasoning and other foods  Salted popcorn and pretzels. Onion salt, garlic salt, seasoned salt, table salt, and sea salt. Worcestershire sauce. Tartar sauce. Barbecue sauce. Teriyaki sauce. Soy sauce, including reduced-sodium. Steak sauce. Canned and packaged gravies. Fish sauce. Oyster sauce. Cocktail sauce. Horseradish that you find on the shelf. Ketchup. Mustard. Meat flavorings and tenderizers. Bouillon cubes. Hot sauce and Tabasco sauce. Premade or packaged marinades. Premade or packaged taco seasonings. Relishes. Regular salad dressings. Where to find more information:  National Heart, Lung, and [place]: https://wilson-eaton.com/  American Heart Association: www.heart.org Summary  The DASH eating plan is a healthy eating plan that has been shown to reduce high blood pressure (hypertension). It may also reduce your risk for  type 2 diabetes, heart disease, and stroke.  With the DASH eating plan, you should limit salt (sodium) intake to 2,300 mg a day. If you have hypertension, you may need to reduce your sodium intake to 1,500 mg a day.  When on the DASH eating plan, aim to eat more fresh fruits and vegetables, whole grains, lean proteins, low-fat dairy, and heart-healthy fats.  Work with your health care provider or diet and nutrition specialist (dietitian) to adjust your eating plan to your individual calorie needs. This information is not intended to replace advice given to you by  your health care provider. Make sure you discuss any questions you have with your health care provider. Document Released: 04/25/2011 Document Revised: 04/29/2016 Document Reviewed: 04/29/2016 Elsevier Interactive Patient Education  2017 [employer].  IF you received an x-ray today, you will receive an invoice from [facility]. Please contact [facility] at [phone] with questions or concerns regarding your invoice.   IF you received labwork today, you will receive an invoice from [facility]. Please contact [facility] at [phone] with questions or concerns regarding your invoice.   Our billing staff will not be able to assist you with questions regarding bills from these companies.  You will be contacted with the lab results as soon as they are available. The fastest way to get your results is to activate your My Chart account. Instructions are located on the last page of this paperwork. If you have not heard from [place] regarding the results in 2 weeks, please contact this office.

## 2016-09-10 ENCOUNTER — Encounter (HOSPITAL_COMMUNITY): Payer: Self-pay

## 2016-09-10 ENCOUNTER — Emergency Department (HOSPITAL_COMMUNITY)
Admission: EM | Admit: 2016-09-10 | Discharge: 2016-09-10 | Disposition: A | Discharge status: Home / Self Care | Payer: 59 | Attending: Emergency Medicine | Admitting: Emergency Medicine

## 2016-09-10 ENCOUNTER — Emergency Department (HOSPITAL_COMMUNITY): Payer: 59

## 2016-09-10 DIAGNOSIS — Y939 Activity, unspecified: Secondary | ICD-10-CM | POA: Diagnosis not present

## 2016-09-10 DIAGNOSIS — Z79899 Other long term (current) drug therapy: Secondary | ICD-10-CM | POA: Insufficient documentation

## 2016-09-10 DIAGNOSIS — Y999 Unspecified external cause status: Secondary | ICD-10-CM | POA: Diagnosis not present

## 2016-09-10 DIAGNOSIS — Y929 Unspecified place or not applicable: Secondary | ICD-10-CM | POA: Diagnosis not present

## 2016-09-10 DIAGNOSIS — S4992XA Unspecified injury of left shoulder and upper arm, initial encounter: Secondary | ICD-10-CM | POA: Diagnosis present

## 2016-09-10 DIAGNOSIS — W108XXA Fall (on) (from) other stairs and steps, initial encounter: Secondary | ICD-10-CM | POA: Diagnosis not present

## 2016-09-10 DIAGNOSIS — J45909 Unspecified asthma, uncomplicated: Secondary | ICD-10-CM | POA: Diagnosis not present

## 2016-09-10 DIAGNOSIS — W19XXXA Unspecified fall, initial encounter: Secondary | ICD-10-CM

## 2016-09-10 DIAGNOSIS — S8991XA Unspecified injury of right lower leg, initial encounter: Secondary | ICD-10-CM

## 2016-09-10 HISTORY — DX: Unspecified asthma, uncomplicated: J45.909

## 2016-09-10 MED ORDER — KETOROLAC TROMETHAMINE 60 MG/2ML IM SOLN
60.0000 mg | Freq: Once | INTRAMUSCULAR | Status: AC
  Administered 2016-09-10: 60 mg via INTRAMUSCULAR
  Filled 2016-09-10: qty 2

## 2016-09-10 NOTE — ED Provider Notes (Signed)
[facility]-EMERGENCY DEPT Provider Note   CSN: [id number] Arrival date & time: 09/10/16  1709   By signing my name below, I, Thelma Barge, attest that this documentation has been prepared under the direction and in the presence of Giovanny Dugal, PA-C. Electronically Signed: Thelma Barge, Scribe. 09/10/16. 7:47 PM.  History   Chief Complaint Chief Complaint  Patient presents with  . Fall    The history is provided by the patient. No language interpreter was used.    HPI Comments: Julie Fernandez is a 26 y.o. female who presents to the Emergency Department complaining of 7 or 8/10 left shoulder pain after falling down the stairs today. Patient states she lost her balance near the edge of the stairs and fell down about 16 wooden stairs. Patient also complains of moderate, throbbing right knee pain. She has not taken any medications for her pain. Patient denies neck/back pain, LOC, head injury, neuro deficits, chest pain, shortness of breath, abdominal pain, nausea/vomiting, or any other complaints. Denies anticoagulation.   Past Medical History:  Diagnosis Date  . Allergy   . Anxiety   . Asthma   . Depression   . Obesity     Patient Active Problem List   Diagnosis Date Noted  . Asthma, chronic 02/13/2016  . Environmental allergies 02/13/2016    Past Surgical History:  Procedure Laterality Date  . BREAST SURGERY     breast reduction 2011  . EYE SURGERY     strabismus    OB History    No data available       Home Medications    Prior to Admission medications   Medication Sig Start Date End Date Taking? Authorizing Provider  albuterol (PROVENTIL HFA;VENTOLIN HFA) 108 (90 Base) MCG/ACT inhaler Inhale 2 puffs into the lungs every 4 (four) hours as needed for wheezing or shortness of breath (cough, shortness of breath or wheezing.). 08/22/15   Emi Belfast, FNP  amoxicillin (AMOXIL) 500 MG capsule Take 1 capsule (500 mg total) by mouth 2 (two) times daily. 08/06/16    Elizabeth Whitney McVey, PA-C  beclomethasone (QVAR) 40 MCG/ACT inhaler Inhale 2 puffs into the lungs 2 (two) times daily. 08/22/15   Emi Belfast, FNP  FLUoxetine (PROZAC) 40 MG capsule Take 1 capsule (40 mg total) by mouth daily. 08/06/16   Elizabeth Whitney McVey, PA-C  fluticasone (FLONASE) 50 MCG/ACT nasal spray Place 2 sprays into both nostrils daily. 02/13/16   Elizabeth Whitney McVey, PA-C  Guaifenesin [facility] MAXIMUM STRENGTH) 1200 MG TB12 Take 1 tablet (1,200 mg total) by mouth every 12 (twelve) hours as needed. 08/06/16   Madelaine Bhat McVey, PA-C  norethindrone-ethinyl estradiol 1/35 (ORTHO-NOVUM, NORTREL,CYCLAFEM) tablet Take 1 tablet by mouth daily. 02/29/16   Emi Belfast, FNP  omeprazole (PRILOSEC) 20 MG capsule TAKE 1 CAPSULE (20 MG TOTAL) BY MOUTH DAILY. 04/23/16   Ethelda Chick, MD    Family History No family history on file.  Social History Social History  Substance Use Topics  . Smoking status: Never Smoker  . Smokeless tobacco: Never Used  . Alcohol use No     Allergies   Patient has no known allergies.   Review of Systems Review of Systems  Respiratory: Negative for shortness of breath.   Cardiovascular: Negative for chest pain.  Gastrointestinal: Negative for abdominal pain, nausea and vomiting.  Musculoskeletal: Positive for arthralgias. Negative for back pain and neck pain.  Skin: Negative for wound.  Neurological: Negative for dizziness, syncope, weakness, light-headedness, numbness and  headaches.  All other systems reviewed and are negative.    Physical Exam Updated Vital Signs BP (!) 148/94 (BP Location: Right Arm)   Pulse (!) 113   Temp 98.2 F (36.8 C) (Oral)   Resp 16   Ht  (1.626 m)   Wt 280 lb (127 kg)   LMP 08/18/2016 (Approximate)   SpO2 96%   BMI 48.06 kg/m   Physical Exam  Constitutional: She appears well-developed and well-nourished. No distress.  HENT:  Head: Normocephalic and atraumatic.  Mouth/Throat:  Oropharynx is clear and moist.  Eyes: Conjunctivae and EOM are normal. Pupils are equal, round, and reactive to light.  Neck: Normal range of motion. Neck supple.  Cardiovascular: Normal rate, regular rhythm, normal heart sounds and intact distal pulses.   Pulmonary/Chest: Effort normal and breath sounds normal. No respiratory distress. She exhibits no tenderness.  Abdominal: Soft. There is no tenderness. There is no guarding.  Musculoskeletal: She exhibits tenderness. She exhibits no edema.  Tenderness to left cervical musculature, left midshaft humerus, and left superior and anterior shoulder. Right knee: Superficial abrasion to anterior surface. Minor swelling to anterior knee. She has full ROM in right knee, although painful. No noted laxity, deformity, effusion, or other abnormalities. Left arm: Full active and passive range of motion in the left elbow, wrists, and hands. Active range of motion in the left shoulder limited by pain. Passive range of motion intact. Normal motor function intact in both other extremities and spine. No midline spinal tenderness.   Neurological: She is alert. No sensory deficit.  No sensory deficits. Strength 5/5 in all extremities. No gait disturbance. Coordination intact including heel to shin and finger to nose. Cranial nerves III-XII grossly intact. No facial droop.   Skin: Skin is warm and dry. Capillary refill takes less than 2 seconds. She is not diaphoretic.  Psychiatric: She has a normal mood and affect. Her behavior is normal.  Nursing note and vitals reviewed.    ED Treatments / Results  DIAGNOSTIC STUDIES: Oxygen Saturation is 96% on RA, normal by my interpretation.    COORDINATION OF CARE: 7:54 PM Discussed treatment plan with pt at bedside and pt agreed to plan.   Labs (all labs ordered are listed, but only abnormal results are displayed) Labs Reviewed - No data to display  EKG  EKG Interpretation None       Radiology Dg Clavicle  Left  Result Date: 09/10/2016 CLINICAL DATA:  26 year old female with history of trauma from a fall complaining of pain in the left shoulder. EXAM: LEFT CLAVICLE - 2+ VIEWS COMPARISON:  No priors. FINDINGS: There is no evidence of fracture or other focal bone lesions. Soft tissues are unremarkable. IMPRESSION: Negative. Electronically Signed   By: Trudie Reed M.D.   On: 09/10/2016 21:17   Dg Shoulder Left  Result Date: 09/10/2016 CLINICAL DATA:  26 year old female with history of trauma after falling down a flight of stairs at home. Pain in the left upper extremity. EXAM: LEFT SHOULDER - 2+ VIEW COMPARISON:  No priors. FINDINGS: There is no evidence of fracture or dislocation. There is no evidence of arthropathy or other focal bone abnormality. Soft tissues are unremarkable. IMPRESSION: Negative. Electronically Signed   By: Trudie Reed M.D.   On: 09/10/2016 21:15   Dg Knee Complete 4 Views Right  Result Date: 09/10/2016 CLINICAL DATA:  Larey Seat down stairs last night. RIGHT knee pain and abrasion. EXAM: RIGHT KNEE - COMPLETE 4+ VIEW COMPARISON:  None. FINDINGS: No evidence  of fracture, dislocation, or joint effusion. No evidence of arthropathy or other focal bone abnormality. Soft tissues are unremarkable. IMPRESSION: Negative. Electronically Signed   By: Awilda Metro M.D.   On: 09/10/2016 21:17   Dg Humerus Left  Result Date: 09/10/2016 CLINICAL DATA:  26 year old female with history of trauma after falling down a flight of stairs earlier tonight. EXAM: LEFT HUMERUS - 2+ VIEW COMPARISON:  None. FINDINGS: There is no evidence of fracture or other focal bone lesions. Soft tissues are unremarkable. IMPRESSION: Negative. Electronically Signed   By: Trudie Reed M.D.   On: 09/10/2016 21:16    Procedures Procedures (including critical care time)  Medications Ordered in ED Medications  ketorolac (TORADOL) injection 60 mg (60 mg Intramuscular Given 09/10/16 2006)     Initial  Impression / Assessment and Plan / ED Course  I have reviewed the triage vital signs and the nursing notes.  Pertinent labs & imaging results that were available during my care of the patient were reviewed by me and considered in my medical decision making (see chart for details).     Patient presents for evaluation following a fall. No noted neuro deficits on exam. No acute abnormalities on x-rays. Part of the patient's physical exam was performed after administration of Toradol and clear x-rays. Orthopedic follow-up. The patient was given instructions for home care as well as return precautions. Patient voices understanding of these instructions, accepts the plan, and is comfortable with discharge.   Vitals:   09/10/16 1720 09/10/16 1723 09/10/16 2141  BP: (!) 148/94  111/70  Pulse: (!) 113  94  Resp: 16  20  Temp: 98.2 F (36.8 C)    TempSrc: Oral    SpO2: 96%  97%  Weight:  127 kg   Height:   (1.626 m)       Final Clinical Impressions(s) / ED Diagnoses   Final diagnoses:  Fall, initial encounter  Injury of left shoulder, initial encounter  Injury of right knee, initial encounter    New Prescriptions Discharge Medication List as of 09/10/2016  9:26 PM    I personally performed the services described in this documentation, which was scribed in my presence. The recorded information has been reviewed and is accurate.    Anselm Pancoast, PA-C 09/11/16 0200    Anselm Pancoast, PA-C 09/11/16 0200    Melene Plan, DO 09/11/16 1505

## 2016-09-10 NOTE — ED Notes (Signed)
Pt transported to radiology.

## 2016-09-10 NOTE — ED Triage Notes (Signed)
Per EMS- patient reports that she tripped while standing at the top of a set of steps (10) and rololed to the bottom. Patient denies hitting her head or having LOC. Patient c/o left shoulder, bilateral ankle and knee pain. Patient was able to bear weight on the left leg and was moving the left arm.

## 2016-09-10 NOTE — Discharge Instructions (Signed)
You have been seen today for shoulder and knee injuries from a fall. There were no acute abnormalities on the x-rays, including no sign of fracture or dislocation. Pain: May take ibuprofen or naproxen to reduce pain and inflammation. Take these types of medications with food to avoid upset stomach. Choose one of these medications, but not both.  Ice: May apply ice to the area over the next 24 hours for 15 minutes at a time to reduce swelling. Elevation: Keep the extremity elevated as often as possible to reduce pain and inflammation. Splint: Wear the knee sleeve for support and comfort. Wear this until pain resolves. Exercises: Start by performing these exercises a few times a week, increasing the frequency until you are performing them twice daily.  Follow up: Follow up with the orthopedic specialist. Call the number provided to set up an appointment.

## 2016-09-11 ENCOUNTER — Other Ambulatory Visit: Payer: Self-pay | Admitting: Family Medicine

## 2016-09-13 NOTE — Telephone Encounter (Signed)
McVey is PCP; will forward refill request.

## 2016-09-13 NOTE — Telephone Encounter (Signed)
Pt used 30 pills within a 4 month timeframe. OK to refill today. Recommend OV for future refills.

## 2016-10-16 ENCOUNTER — Other Ambulatory Visit: Payer: Self-pay | Admitting: Physician Assistant

## 2016-12-12 ENCOUNTER — Other Ambulatory Visit: Payer: Self-pay | Admitting: Physician Assistant

## 2016-12-15 ENCOUNTER — Emergency Department (HOSPITAL_COMMUNITY): Payer: 59

## 2016-12-15 ENCOUNTER — Emergency Department (HOSPITAL_COMMUNITY)
Admission: EM | Admit: 2016-12-15 | Discharge: 2016-12-15 | Disposition: A | Discharge status: Home / Self Care | Payer: 59 | Attending: Emergency Medicine | Admitting: Emergency Medicine

## 2016-12-15 ENCOUNTER — Encounter (HOSPITAL_COMMUNITY): Payer: Self-pay | Admitting: Emergency Medicine

## 2016-12-15 DIAGNOSIS — R000 Tachycardia, unspecified: Secondary | ICD-10-CM | POA: Insufficient documentation

## 2016-12-15 DIAGNOSIS — Z5329 Procedure and treatment not carried out because of patient's decision for other reasons: Secondary | ICD-10-CM

## 2016-12-15 DIAGNOSIS — Z79899 Other long term (current) drug therapy: Secondary | ICD-10-CM | POA: Insufficient documentation

## 2016-12-15 DIAGNOSIS — Z5320 Procedure and treatment not carried out because of patient's decision for unspecified reasons: Secondary | ICD-10-CM

## 2016-12-15 DIAGNOSIS — R109 Unspecified abdominal pain: Secondary | ICD-10-CM

## 2016-12-15 DIAGNOSIS — J45909 Unspecified asthma, uncomplicated: Secondary | ICD-10-CM | POA: Insufficient documentation

## 2016-12-15 DIAGNOSIS — R1031 Right lower quadrant pain: Secondary | ICD-10-CM | POA: Insufficient documentation

## 2016-12-15 LAB — CBC
HCT: 40 % (ref 36.0–46.0)
HEMOGLOBIN: 12.9 g/dL (ref 12.0–15.0)
MCH: 26.4 pg (ref 26.0–34.0)
MCHC: 32.3 g/dL (ref 30.0–36.0)
MCV: 82 fL (ref 78.0–100.0)
Platelets: 285 10*3/uL (ref 150–400)
RBC: 4.88 MIL/uL (ref 3.87–5.11)
RDW: 13.6 % (ref 11.5–15.5)
WBC: 10 10*3/uL (ref 4.0–10.5)

## 2016-12-15 LAB — URINALYSIS, ROUTINE W REFLEX MICROSCOPIC
BILIRUBIN URINE: NEGATIVE
Glucose, UA: NEGATIVE mg/dL
HGB URINE DIPSTICK: NEGATIVE
KETONES UR: NEGATIVE mg/dL
NITRITE: NEGATIVE
Protein, ur: 30 mg/dL — AB
SPECIFIC GRAVITY, URINE: 1.025 (ref 1.005–1.030)
pH: 5 (ref 5.0–8.0)

## 2016-12-15 LAB — COMPREHENSIVE METABOLIC PANEL
ALBUMIN: 3.7 g/dL (ref 3.5–5.0)
ALT: 33 U/L (ref 14–54)
ANION GAP: 12 (ref 5–15)
AST: 39 U/L (ref 15–41)
Alkaline Phosphatase: 66 U/L (ref 38–126)
BILIRUBIN TOTAL: 0.3 mg/dL (ref 0.3–1.2)
BUN: 9 mg/dL (ref 6–20)
CO2: 21 mmol/L — ABNORMAL LOW (ref 22–32)
Calcium: 9.5 mg/dL (ref 8.9–10.3)
Chloride: 107 mmol/L (ref 101–111)
Creatinine, Ser: 0.85 mg/dL (ref 0.44–1.00)
Glucose, Bld: 126 mg/dL — ABNORMAL HIGH (ref 65–99)
POTASSIUM: 4.1 mmol/L (ref 3.5–5.1)
Sodium: 140 mmol/L (ref 135–145)
TOTAL PROTEIN: 7.4 g/dL (ref 6.5–8.1)

## 2016-12-15 LAB — I-STAT BETA HCG BLOOD, ED (MC, WL, AP ONLY)

## 2016-12-15 LAB — LIPASE, BLOOD: Lipase: 28 U/L (ref 11–51)

## 2016-12-15 MED ORDER — MORPHINE SULFATE (PF) 4 MG/ML IV SOLN
4.0000 mg | Freq: Once | INTRAVENOUS | Status: AC
  Administered 2016-12-15: 4 mg via INTRAVENOUS
  Filled 2016-12-15: qty 1

## 2016-12-15 MED ORDER — ONDANSETRON 4 MG PO TBDP
ORAL_TABLET | ORAL | Status: AC
  Filled 2016-12-15: qty 1

## 2016-12-15 MED ORDER — IOPAMIDOL (ISOVUE-300) INJECTION 61%
INTRAVENOUS | Status: AC
  Administered 2016-12-15: 100 mL
  Filled 2016-12-15: qty 100

## 2016-12-15 MED ORDER — ONDANSETRON 4 MG PO TBDP
4.0000 mg | ORAL_TABLET | Freq: Once | ORAL | Status: AC | PRN
  Administered 2016-12-15: 4 mg via ORAL

## 2016-12-15 MED ORDER — ONDANSETRON HCL 4 MG/2ML IJ SOLN
4.0000 mg | Freq: Once | INTRAMUSCULAR | Status: AC
  Administered 2016-12-15: 4 mg via INTRAVENOUS
  Filled 2016-12-15: qty 2

## 2016-12-15 NOTE — ED Provider Notes (Signed)
[facility]-EMERGENCY DEPT Provider Note   CSN: [id number] Arrival date & time: 12/15/16  0254     History   Chief Complaint Chief Complaint  Patient presents with  . Abdominal Pain    HPI Julie Fernandez is a 26 y.o. female.  Patient no significant contributing medical history presents with pain in the right abdomen that started 4-5 days ago. During the first 3 days the pain was intermittent and over the last 2 days has been progressively worsening and constant. She has had nausea with the worst pain but no vomiting, no diarrhea and no urinary symptoms. She reports the pain is worse with eating, but also that the RLQ is more painful than the RUQ.     Abdominal Pain   Associated symptoms include nausea. Pertinent negatives include fever and vomiting.    Past Medical History:  Diagnosis Date  . Allergy   . Anxiety   . Asthma   . Depression   . Obesity     Patient Active Problem List   Diagnosis Date Noted  . Asthma, chronic 02/13/2016  . Environmental allergies 02/13/2016    Past Surgical History:  Procedure Laterality Date  . BREAST SURGERY     breast reduction 2012  . EYE SURGERY     strabismus    OB History    No data available       Home Medications    Prior to Admission medications   Medication Sig Start Date End Date Taking? Authorizing Provider  FLUoxetine (PROZAC) 40 MG capsule Take 1 capsule (40 mg total) by mouth daily. 08/06/16  Yes McVey, Madelaine BhatElizabeth Whitney, PA-C  norethindrone-ethinyl estradiol 1/35 (ORTHO-NOVUM, NORTREL,CYCLAFEM) tablet Take 1 tablet by mouth daily. 02/29/16  Yes Emi BelfastGessner, Deborah B, FNP  omeprazole (PRILOSEC) 20 MG capsule TAKE 1 CAPSULE BY MOUTH EVERY DAY 12/13/16  Yes McVey, Madelaine BhatElizabeth Whitney, PA-C  albuterol (PROVENTIL HFA;VENTOLIN HFA) 108 (90 Base) MCG/ACT inhaler Inhale 2 puffs into the lungs every 4 (four) hours as needed for wheezing or shortness of breath (cough, shortness of breath or wheezing.). Patient not taking: Reported  on 12/15/2016 08/22/15   Emi BelfastGessner, Deborah B, FNP  amoxicillin (AMOXIL) 500 MG capsule Take 1 capsule (500 mg total) by mouth 2 (two) times daily. Patient not taking: Reported on 12/15/2016 08/06/16   McVey, Madelaine BhatElizabeth Whitney, PA-C  beclomethasone (QVAR) 40 MCG/ACT inhaler Inhale 2 puffs into the lungs 2 (two) times daily. Patient not taking: Reported on 12/15/2016 08/22/15   Emi BelfastGessner, Deborah B, FNP  fluticasone [facility](FLONASE) 50 MCG/ACT nasal spray Place 2 sprays into both nostrils daily. Patient not taking: Reported on 12/15/2016 02/13/16   McVey, Madelaine BhatElizabeth Whitney, PA-C  Guaifenesin [facility](MUCINEX MAXIMUM STRENGTH) 1200 MG TB12 Take 1 tablet (1,200 mg total) by mouth every 12 (twelve) hours as needed. Patient not taking: Reported on 12/15/2016 08/06/16   McVey, Madelaine BhatElizabeth Whitney, PA-C    Family History History reviewed. No pertinent family history.  Social History Social History  Substance Use Topics  . Smoking status: Never Smoker  . Smokeless tobacco: Never Used  . Alcohol use No     Allergies   Patient has no known allergies.   Review of Systems Review of Systems  Constitutional: Negative for chills and fever.  HENT: Negative.   Respiratory: Negative.   Cardiovascular: Negative.   Gastrointestinal: Positive for abdominal pain and nausea. Negative for vomiting.  Musculoskeletal: Negative.   Skin: Negative.   Neurological: Negative.      Physical Exam Updated Vital Signs BP 137/87 (BP  Location: Right Arm)   Pulse (!) 122   Temp 98.1 F (36.7 C) (Oral)   Resp 18   Ht 5\' 4"  (1.626 m)   Wt 127 kg (280 lb)   LMP 12/02/2016   SpO2 100%   BMI 48.06 kg/m   Physical Exam  Constitutional: She is oriented to person, place, and time. She appears well-developed and well-nourished.  HENT:  Head: Normocephalic.  Neck: Normal range of motion. Neck supple.  Cardiovascular: Tachycardia present.   Pulmonary/Chest: Effort normal.  Abdominal: Soft. Bowel sounds are normal. There is tenderness  (RLQ<RUQ tenderness, although significant tenderness in both quadrants. ). There is no rebound and no guarding.  Musculoskeletal: Normal range of motion.  Neurological: She is alert and oriented to person, place, and time.  Skin: Skin is warm and dry. No rash noted.  Psychiatric: She has a normal mood and affect.     ED Treatments / Results  Labs (all labs ordered are listed, but only abnormal results are displayed) Labs Reviewed  COMPREHENSIVE METABOLIC PANEL - Abnormal; Notable for the following:       Result Value   CO2 21 (*)    Glucose, Bld 126 (*)    All other components within normal limits  LIPASE, BLOOD  CBC  URINALYSIS, ROUTINE W REFLEX MICROSCOPIC  I-STAT BETA HCG BLOOD, ED ([facility], [facility], [facility] ONLY)   Results for orders placed or performed during the hospital encounter of 12/15/16  Lipase, blood  Result Value Ref Range   Lipase 28 11 - 51 U/L  Comprehensive metabolic panel  Result Value Ref Range   Sodium 140 135 - 145 mmol/L   Potassium 4.1 3.5 - 5.1 mmol/L   Chloride 107 101 - 111 mmol/L   CO2 21 (L) 22 - 32 mmol/L   Glucose, Bld 126 (H) 65 - 99 mg/dL   BUN 9 6 - 20 mg/dL   Creatinine, Ser 1.610.85 0.44 - 1.00 mg/dL   Calcium 9.5 8.9 - 09.610.3 mg/dL   Total Protein 7.4 6.5 - 8.1 g/dL   Albumin 3.7 3.5 - 5.0 g/dL   AST 39 15 - 41 U/L   ALT 33 14 - 54 U/L   Alkaline Phosphatase 66 38 - 126 U/L   Total Bilirubin 0.3 0.3 - 1.2 mg/dL   GFR calc non Af Amer >60 >60 mL/min   GFR calc Af Amer >60 >60 mL/min   Anion gap 12 5 - 15  CBC  Result Value Ref Range   WBC 10.0 4.0 - 10.5 K/uL   RBC 4.88 3.87 - 5.11 MIL/uL   Hemoglobin 12.9 12.0 - 15.0 g/dL   HCT 04.540.0 40.936.0 - 81.146.0 %   MCV 82.0 78.0 - 100.0 fL   MCH 26.4 26.0 - 34.0 pg   MCHC 32.3 30.0 - 36.0 g/dL   RDW 91.413.6 78.211.5 - 95.615.5 %   Platelets 285 150 - 400 K/uL  I-Stat beta hCG blood, ED  Result Value Ref Range   I-stat hCG, quantitative <5.0 <5 mIU/mL   Comment 3            EKG  EKG Interpretation None        Radiology No results found.  Procedures Procedures (including critical care time)  Medications Ordered in ED Medications  ondansetron (ZOFRAN-ODT) 4 MG disintegrating tablet (  Canceled Entry 12/15/16 0538)  ondansetron (ZOFRAN-ODT) disintegrating tablet 4 mg (4 mg Oral Given 12/15/16 0308)  morphine 4 MG/ML injection 4 mg (4 mg Intravenous Given 12/15/16  0459)  ondansetron (ZOFRAN) injection 4 mg (4 mg Intravenous Given 12/15/16 0459)     Initial Impression / Assessment and Plan / ED Course  I have reviewed the triage vital signs and the nursing notes.  Pertinent labs & imaging results that were available during my care of the patient were reviewed by me and considered in my medical decision making (see chart for details).     Patient presents with right sided abdominal pain that started 4-5 days ago.   She has significant tenderness to the RU and RL quadrants. She reports pain is worse after eating. Given the post-prandial symptoms will obtain RUQ [place]. If negative, will consider CT scan based on re-evaluation.  [place] is negative for gall stones. Patient is re-evaluated. Pain is better but remains tender on re-examination. Will proceed with CT scan. Discussed results with the patient and the need for a pelvic exam for complete evaluation. The patient declined the exam stating that she is not comfortable with the exam, and would want her mother here with her.   CT scan ordered. VS improved but tachycardia is persistent. Patient care signed out to [facility], PA-C, for follow up on results and disposition.   Final Clinical Impressions(s) / ED Diagnoses   Final diagnoses:  None   1. Abdominal pain 2. tachycardia  New Prescriptions New Prescriptions   No medications on file     Danne Harbor 12/15/16 5621    Zadie Rhine, MD 12/29/16 2324

## 2016-12-15 NOTE — ED Provider Notes (Signed)
PROGRESS NOTE                                                                                                                 This is a sign-out from PA UPstill at shift change: Julie Fernandez is a 26 y.o. female presenting with sided abdominal pain, this was postprandial so initially a right upper quadrant ultrasound was obtained which is negative. Will move forward to CT. Patient is declining pelvic at this time. Patient afebrile but tachycardic, blood work reassuring, urinalysis without significant signs of infection. Patient feels better after morphine. Please refer to previous note for full HPI, ROS, PMH and PE.   Patient initially tachycardic however this is resolved on my exam. I have informed her of her blood work, right upper quadrant ultrasound and CAT scan results which are negative. This does not explain her severe right-sided abdominal pain. I would like to perform a pelvic exam and get a pelvic ultrasound to rule out torsion, or infection. I spoken with the patient's mother on the phone and explained the risks and benefits of going forward with a full evaluation, the patient's mother has advised her to proceed forward with pelvic exam and pelvic ultrasound.   Julie Fernandez is an adult of sound mind and is alert and oriented x3, has capacity for medical decision making and can meaningfully discuss risks, benfeits and alternatives to treatment. We have discussed the signs and symptoms of right-sided abdominal pain and results of normal CAT scan and normal right upper quadrant ultrasound which do not explain this patient's pain and therefore I am concerned for ovarian torsion or pelvic infection. We have discussed the limitations of the exam and recommended treatment plan of pelvic exam and pelvic ultrasound.  We have discussed the risks of forgoing treatment including death, disability and infertility. We have also discussed treatment plan alternatives in  a shared decision making capacity of only  obtaining a pelvic ultrasound to rule out ovarian torsion, which the patient has refused. I have asked to speak with family members, and have discussed my concerns with her stepfather and her mother.. We have had multiple discussions about my  concerns and potential alternative treatment plans over the course of their stay in the ED. I have asked the nursing staff to also encourage the patient to stay.Julie Fernandez has specifically refused pelvic exam and pelvic ultrasound and is refusing any further care and is leaving against medical advice. I have answered all of the patients questions, advised them to not hesitate to call 911 and return to the ED if they change their mind and have arranged for outpatient care to follow with primary care physician.     Kaylyn Limisciotta, Pansey Pinheiro, PA-C 12/15/16 1004    Zadie RhineWickline, Donald, MD 12/16/16 [phone]

## 2016-12-15 NOTE — Discharge Instructions (Signed)
Toni AmendCourtney, chosen to leave the emergency department AGAINST MEDICAL ADVICE. As we have discussed I cannot fully evaluate your pain without further testing. I hope you change your mind and return to the emergency department at any time. You can call 911. Please check in with her primary care doctor early this week.   For pain control please take ibuprofen (also known as Motrin or Advil) 800mg  (this is normally 4 over the counter pills) 3 times a day  for 5 days. Take with food to minimize stomach irritation.

## 2016-12-15 NOTE — ED Notes (Signed)
Pt given specimen cup and will try to void after nurse is done with triage.

## 2016-12-15 NOTE — ED Triage Notes (Signed)
Pt presents with R sided abd pain with swelling and nausea; pt denies any redness or drainage to skin; pt reports regular BMs and menstrual cycles; pt denies diarrhea or vomiting

## 2016-12-15 NOTE — ED Notes (Signed)
Pt stated was able to urinate but urine went in toilet instead of specimen cup.   Pt will try again soon.

## 2016-12-15 NOTE — ED Notes (Signed)
Pt returns from CT. NAD family remains at bedside

## 2016-12-15 NOTE — ED Notes (Signed)
Pt states she understands that pelvic ultrasound is next in evakluation but declines sdame. Pain unchanged and home with family.

## 2017-01-03 ENCOUNTER — Ambulatory Visit (INDEPENDENT_AMBULATORY_CARE_PROVIDER_SITE_OTHER): Payer: 59

## 2017-01-03 ENCOUNTER — Encounter: Payer: Self-pay | Admitting: Physician Assistant

## 2017-01-03 ENCOUNTER — Ambulatory Visit (INDEPENDENT_AMBULATORY_CARE_PROVIDER_SITE_OTHER): Payer: 59 | Admitting: Physician Assistant

## 2017-01-03 VITALS — BP 146/98 | HR 115 | Temp 98.9°F | Resp 16 | Ht 63.0 in | Wt 296.4 lb

## 2017-01-03 DIAGNOSIS — R1012 Left upper quadrant pain: Secondary | ICD-10-CM | POA: Diagnosis not present

## 2017-01-03 DIAGNOSIS — N3001 Acute cystitis with hematuria: Secondary | ICD-10-CM

## 2017-01-03 DIAGNOSIS — R110 Nausea: Secondary | ICD-10-CM | POA: Diagnosis not present

## 2017-01-03 LAB — POC MICROSCOPIC URINALYSIS (UMFC): Mucus: ABSENT

## 2017-01-03 LAB — POCT URINALYSIS DIP (MANUAL ENTRY)
Bilirubin, UA: NEGATIVE
Glucose, UA: NEGATIVE mg/dL
Ketones, POC UA: NEGATIVE mg/dL
Nitrite, UA: NEGATIVE
Protein Ur, POC: NEGATIVE mg/dL
Spec Grav, UA: 1.025 (ref 1.010–1.025)
Urobilinogen, UA: 0.2 E.U./dL
pH, UA: 5.5 (ref 5.0–8.0)

## 2017-01-03 LAB — POCT CBC
Granulocyte percent: 66.9 %G (ref 37–80)
HCT, POC: 40 % (ref 37.7–47.9)
Hemoglobin: 13.5 g/dL (ref 12.2–16.2)
Lymph, poc: 1.7 (ref 0.6–3.4)
MCH, POC: 26.5 pg — AB (ref 27–31.2)
MCHC: 33.7 g/dL (ref 31.8–35.4)
MCV: 78.7 fL — AB (ref 80–97)
MID (cbc): 0.6 (ref 0–0.9)
MPV: 7.4 fL (ref 0–99.8)
POC Granulocyte: 4.7 (ref 2–6.9)
POC LYMPH PERCENT: 24.4 % (ref 10–50)
POC MID %: 8.7 %M (ref 0–12)
Platelet Count, POC: 322 10*3/uL (ref 142–424)
RBC: 5.08 M/uL (ref 4.04–5.48)
RDW, POC: 13.6 %
WBC: 7.1 10*3/uL (ref 4.6–10.2)

## 2017-01-03 LAB — GLUCOSE, POCT (MANUAL RESULT ENTRY): POC Glucose: 84 mg/dl (ref 70–99)

## 2017-01-03 LAB — POCT GLYCOSYLATED HEMOGLOBIN (HGB A1C): Hemoglobin A1C: 5.5

## 2017-01-03 MED ORDER — NITROFURANTOIN MONOHYD MACRO 100 MG PO CAPS: 100.0000 mg | ORAL_CAPSULE | Freq: Two times a day (BID) | ORAL | 0 refills | Status: DC

## 2017-01-03 MED ORDER — GI COCKTAIL ~~LOC~~
30.0000 mL | Freq: Once | ORAL | Status: AC
  Administered 2017-01-03: 30 mL via ORAL

## 2017-01-03 NOTE — Patient Instructions (Addendum)
You will receive a phone cal from GI to schedule an appt.  Take Macrobid for UTI   Thank you for coming in today. I hope you feel we met your needs.  Feel free to call PCP if you have any questions or further requests.  Please consider signing up for MyChart if you do not already have it, as this is a great way to communicate with me.  Best,  Whitney McVey, PA-C   IF you received an x-ray today, you will receive an invoice from [facility]. Please contact [facility] at [phone] with questions or concerns regarding your invoice.   IF you received labwork today, you will receive an invoice from [facility]. Please contact [facility] at [phone] with questions or concerns regarding your invoice.   Our billing staff will not be able to assist you with questions regarding bills from these companies.  You will be contacted with the lab results as soon as they are available. The fastest way to get your results is to activate your My Chart account. Instructions are located on the last page of this paperwork. If you have not heard from us regarding the results in 2 weeks, please contact this office.     

## 2017-01-03 NOTE — Progress Notes (Signed)
Julie Fernandez  MRN: [id number] DOB: 06-21-1990  PCP: Julie Hiss, PA-C  Subjective:  Pt is a 26 year old female PMH obesity who presents to clinic for abdominal pain x 1 month.  C/o swelling and nausea. She is nauseous immediately after eating. She ate [place] food last night.  Pain is constant  Pain does not radiate - right side only.  Describes sensation as sometimes dull and sometimes sharp.  C/o chills. Unknown fever.  Every time she eats she gets nauseous.  stools are like water 2-3 times a day. Last BM was this morning - loose stool light brown. No blood. Does not have to strain to have BM.  She has tried pepto and her mother's phenergan - not helping.  Denies urinary symptoms, vaginal bleeding, abnormal menstrual cramping, new-onset back pain, groin pain, vomiting.   Went to ED for this problem on 7/29 - [place] and abdominal/pelvic CT were negative. When asking pt about her experience at the ED, she did not recall a CT was done. When I discussed these results with her she states "Oh yeah, I forgot they did that". She would like a HIDA scan, as her mother told her this problem is due to her gallbladder. She refused pelvic US.  Per ED note, she left AMA.   Review of Systems  Constitutional: Positive for chills. Negative for appetite change, diaphoresis, fatigue and fever.  Gastrointestinal: Positive for abdominal distention, abdominal pain, diarrhea and nausea. Negative for blood in stool, constipation and vomiting.  Neurological: Negative for dizziness and headaches.    Patient Active Problem List   Diagnosis Date Noted  . Asthma, chronic 02/13/2016  . Environmental allergies 02/13/2016    Current Outpatient Prescriptions on File Prior to Visit  Medication Sig Dispense Refill  . albuterol (PROVENTIL HFA;VENTOLIN HFA) 108 (90 Base) MCG/ACT inhaler Inhale 2 puffs into the lungs every 4 (four) hours as needed for wheezing or shortness of breath (cough, shortness of  breath or wheezing.). 1 Inhaler 1  . beclomethasone (QVAR) 40 MCG/ACT inhaler Inhale 2 puffs into the lungs 2 (two) times daily. 1 Inhaler 12  . FLUoxetine (PROZAC) 40 MG capsule Take 1 capsule (40 mg total) by mouth daily. 90 capsule 3  . norethindrone-ethinyl estradiol 1/35 (ORTHO-NOVUM, NORTREL,CYCLAFEM) tablet Take 1 tablet by mouth daily. 3 Package 4  . omeprazole (PRILOSEC) 20 MG capsule TAKE 1 CAPSULE BY MOUTH EVERY DAY 30 capsule 0  . fluticasone (FLONASE) 50 MCG/ACT nasal spray Place 2 sprays into both nostrils daily. (Patient not taking: Reported on 01/03/2017) 16 g 12   No current facility-administered medications on file prior to visit.     No Known Allergies   Objective:  BP (!) 146/98   Pulse (!) 115   Temp 98.9 F (37.2 C) (Oral)   Resp 16   Ht _0  (1.6 m)   Wt 296 lb 6.4 oz (134.4 kg)   LMP 01/01/2017   SpO2 98%   BMI 52.50 kg/m   Orthostatic VS for the past 24 hrs:  BP- Lying Pulse- Lying BP- Sitting Pulse- Sitting BP- Standing at 0 minutes Pulse- Standing at 0 minutes  01/03/17 1215 118/89 98 (!) 146/98 115 (!) 159/115 114    Physical Exam  Constitutional: She is oriented to person, place, and time and well-developed, well-nourished, and in no distress. No distress.  Cardiovascular: Normal rate, regular rhythm, normal heart sounds and normal pulses.   Abdominal: Soft. Normal appearance and bowel sounds are normal. She exhibits  no distension. There is no hepatosplenomegaly. There is tenderness (Left side). There is no rigidity, no guarding, no CVA tenderness, no tenderness at McBurney's point and negative Murphy's sign.  Central obesity.   Neurological: She is alert and oriented to person, place, and time. GCS score is 15.  Skin: Skin is warm and dry.  Psychiatric: Mood, memory, affect and judgment normal.  Vitals reviewed.   Dg Abd 2 Views  Result Date: 01/03/2017 CLINICAL DATA:  Abdominal pain, bloating for 1 month EXAM: ABDOMEN - 2 VIEW COMPARISON:   12/15/2016 FINDINGS: The bowel gas pattern is normal. There is no evidence of free air. No radio-opaque calculi or other significant radiographic abnormality is seen. IMPRESSION: Negative. Electronically Signed   By: Julie Fernandez   On: 01/03/2017 13:04   Results for orders placed or performed in visit on 01/03/17  POCT glucose (manual entry)  Result Value Ref Range   POC Glucose 84 70 - 99 mg/dl  POCT glycosylated hemoglobin (Hb A1C)  Result Value Ref Range   Hemoglobin A1C 5.5   POCT urinalysis dipstick  Result Value Ref Range   Color, UA yellow yellow   Clarity, UA clear clear   Glucose, UA negative negative mg/dL   Bilirubin, UA negative negative   Ketones, POC UA negative negative mg/dL   Spec Grav, UA 1.025 1.010 - 1.025   Blood, UA moderate (A) negative   pH, UA 5.5 5.0 - 8.0   Protein Ur, POC negative negative mg/dL   Urobilinogen, UA 0.2 0.2 or 1.0 E.U./dL   Nitrite, UA Negative Negative   Leukocytes, UA Trace (A) Negative  POCT CBC  Result Value Ref Range   WBC 7.1 4.6 - 10.2 K/uL   Lymph, poc 1.7 0.6 - 3.4   POC LYMPH PERCENT 24.4 10 - 50 %L   MID (cbc) 0.6 0 - 0.9   POC MID % 8.7 0 - 12 %M   POC Granulocyte 4.7 2 - 6.9   Granulocyte percent 66.9 37 - 80 %G   RBC 5.08 4.04 - 5.48 M/uL   Hemoglobin 13.5 12.2 - 16.2 g/dL   HCT, POC 40.0 37.7 - 47.9 %   MCV 78.7 (A) 80 - 97 fL   MCH, POC 26.5 (A) 27 - 31.2 pg   MCHC 33.7 31.8 - 35.4 g/dL   RDW, POC 13.6 %   Platelet Count, POC 322 142 - 424 K/uL   MPV 7.4 0 - 99.8 fL    Assessment and Plan :  1. Left upper quadrant pain 2. Nausea without vomiting - POCT glucose (manual entry) - POCT glycosylated hemoglobin (Hb A1C) - DG Abd 2 Views; Future - POCT urinalysis dipstick - POCT Microscopic Urinalysis (UMFC) - POCT CBC - CMP14+EGFR - Urine Culture - gi cocktail (Maalox,Lidocaine,Donnatal); Take 30 mLs by mouth once. - Ambulatory referral to Gastroenterology - This is Julie Fernandez's second evaluation for her  right-sided abdominal pain, her initial eval was 2 weeks ag in the ED. Thus far, CT abdomen pelvis, [place] LUQ and x-ray of abdomen are all negative. Her lab work is also unremarkable, consistent with labs from ED. She declines pelvic examination. Pt reports mild relief after GI cocktail. She wants a HIDA scan, as her mother told her this problem is likely her gall bladder. Will refer to GI for evaluation. Advised pt to RTC if no resolution or go to the emergency department if this problem worsens. She understands and agrees with plan.  3. Acute cystitis with hematuria -  nitrofurantoin, macrocrystal-monohydrate, (MACROBID) 100 MG capsule; Take 1 capsule (100 mg total) by mouth 2 (two) times daily.  Dispense: 20 capsule; Refill: 0   Mercer Pod, PA-C  [facility] 01/03/2017 12:23 PM

## 2017-01-04 LAB — CMP14+EGFR
ALT: 22 IU/L (ref 0–32)
AST: 22 IU/L (ref 0–40)
Albumin/Globulin Ratio: 1.3 (ref 1.2–2.2)
Albumin: 4.3 g/dL (ref 3.5–5.5)
Alkaline Phosphatase: 79 IU/L (ref 39–117)
BUN/Creatinine Ratio: 13 (ref 9–23)
BUN: 10 mg/dL (ref 6–20)
Bilirubin Total: <0.2 mg/dL (ref 0.0–1.2)
CO2: 19 mmol/L — ABNORMAL LOW (ref 20–29)
Calcium: 9.4 mg/dL (ref 8.7–10.2)
Chloride: 101 mmol/L (ref 96–106)
Creatinine, Ser: 0.79 mg/dL (ref 0.57–1.00)
GFR calc Af Amer: 120 mL/min/{1.73_m2} (ref >59)
GFR calc non Af Amer: 104 mL/min/{1.73_m2} (ref >59)
Globulin, Total: 3.3 g/dL (ref 1.5–4.5)
Glucose: 92 mg/dL (ref 65–99)
Potassium: 4.3 mmol/L (ref 3.5–5.2)
Sodium: 141 mmol/L (ref 134–144)
Total Protein: 7.6 g/dL (ref 6.0–8.5)

## 2017-01-06 LAB — URINE CULTURE

## 2017-01-08 ENCOUNTER — Encounter: Payer: Self-pay | Admitting: Gastroenterology

## 2017-03-01 ENCOUNTER — Emergency Department (HOSPITAL_COMMUNITY)
Admission: EM | Admit: 2017-03-01 | Discharge: 2017-03-02 | Discharge status: Against Medical Advice | Payer: 59 | Attending: Emergency Medicine | Admitting: Emergency Medicine

## 2017-03-01 ENCOUNTER — Encounter (HOSPITAL_COMMUNITY): Payer: Self-pay | Admitting: *Deleted

## 2017-03-01 DIAGNOSIS — Z5321 Procedure and treatment not carried out due to patient leaving prior to being seen by health care provider: Secondary | ICD-10-CM | POA: Insufficient documentation

## 2017-03-01 DIAGNOSIS — R109 Unspecified abdominal pain: Secondary | ICD-10-CM | POA: Insufficient documentation

## 2017-03-01 LAB — COMPREHENSIVE METABOLIC PANEL
ALBUMIN: 3.6 g/dL (ref 3.5–5.0)
ALT: 28 U/L (ref 14–54)
AST: 27 U/L (ref 15–41)
Alkaline Phosphatase: 66 U/L (ref 38–126)
Anion gap: 10 (ref 5–15)
BILIRUBIN TOTAL: 0.5 mg/dL (ref 0.3–1.2)
BUN: 9 mg/dL (ref 6–20)
CO2: 23 mmol/L (ref 22–32)
Calcium: 9.1 mg/dL (ref 8.9–10.3)
Chloride: 101 mmol/L (ref 101–111)
Creatinine, Ser: 0.72 mg/dL (ref 0.44–1.00)
GFR calc Af Amer: >60 mL/min (ref >60)
GFR calc non Af Amer: >60 mL/min (ref >60)
Glucose, Bld: 89 mg/dL (ref 65–99)
POTASSIUM: 3.6 mmol/L (ref 3.5–5.1)
SODIUM: 134 mmol/L — AB (ref 135–145)
Total Protein: 7.8 g/dL (ref 6.5–8.1)

## 2017-03-01 LAB — CBC
HCT: 40.2 % (ref 36.0–46.0)
HEMOGLOBIN: 13.1 g/dL (ref 12.0–15.0)
MCH: 26.6 pg (ref 26.0–34.0)
MCHC: 32.6 g/dL (ref 30.0–36.0)
MCV: 81.7 fL (ref 78.0–100.0)
Platelets: 267 10*3/uL (ref 150–400)
RBC: 4.92 MIL/uL (ref 3.87–5.11)
RDW: 14 % (ref 11.5–15.5)
WBC: 7.2 10*3/uL (ref 4.0–10.5)

## 2017-03-01 LAB — URINALYSIS, ROUTINE W REFLEX MICROSCOPIC
Bilirubin Urine: NEGATIVE
GLUCOSE, UA: NEGATIVE mg/dL
HGB URINE DIPSTICK: NEGATIVE
Ketones, ur: NEGATIVE mg/dL
Nitrite: NEGATIVE
Protein, ur: NEGATIVE mg/dL
SPECIFIC GRAVITY, URINE: 1.02 (ref 1.005–1.030)
pH: 6 (ref 5.0–8.0)

## 2017-03-01 LAB — URINALYSIS, MICROSCOPIC (REFLEX)

## 2017-03-01 LAB — I-STAT BETA HCG BLOOD, ED (MC, WL, AP ONLY)

## 2017-03-01 LAB — LIPASE, BLOOD: Lipase: 23 U/L (ref 11–51)

## 2017-03-01 NOTE — ED Notes (Signed)
Pt called for a room, no response 

## 2017-03-01 NOTE — ED Triage Notes (Signed)
Pt reports generalized abd pain x 3 months. Only other symptom she reports is diarrhea. Denies urinary or vaginal symptoms. No acute distress is noted at triage.

## 2017-03-01 NOTE — ED Notes (Signed)
Pt.'s name was called multiple times for her vitals to be re-evaluated and has not responded in several hours.

## 2017-03-03 ENCOUNTER — Ambulatory Visit (INDEPENDENT_AMBULATORY_CARE_PROVIDER_SITE_OTHER): Payer: 59 | Admitting: Gastroenterology

## 2017-03-03 ENCOUNTER — Encounter: Payer: Self-pay | Admitting: Gastroenterology

## 2017-03-03 ENCOUNTER — Encounter (INDEPENDENT_AMBULATORY_CARE_PROVIDER_SITE_OTHER): Payer: Self-pay

## 2017-03-03 VITALS — BP 110/84 | HR 72 | Ht 64.0 in | Wt 297.0 lb

## 2017-03-03 DIAGNOSIS — R1031 Right lower quadrant pain: Secondary | ICD-10-CM | POA: Diagnosis not present

## 2017-03-03 DIAGNOSIS — R110 Nausea: Secondary | ICD-10-CM

## 2017-03-03 DIAGNOSIS — K529 Noninfective gastroenteritis and colitis, unspecified: Secondary | ICD-10-CM | POA: Diagnosis not present

## 2017-03-03 MED ORDER — HYOSCYAMINE SULFATE 0.125 MG SL SUBL: 0.1250 mg | SUBLINGUAL_TABLET | Freq: Four times a day (QID) | SUBLINGUAL | 1 refills | Status: DC | PRN

## 2017-03-03 MED ORDER — HYOSCYAMINE SULFATE 0.125 MG SL SUBL: 0.1250 mg | SUBLINGUAL_TABLET | SUBLINGUAL | 1 refills | Status: DC | PRN

## 2017-03-03 NOTE — Patient Instructions (Signed)
It has been recommended to you by your physician that you have a(n) EGD/Colon completed. Per your request, we did not schedule the procedure(s) today. Please contact our office at [phone] should you decide to have the procedure completed.  If you are age 26 or older, your body mass index should be between 23-30. Your Body mass index is 50.98 kg/m. If this is out of the aforementioned range listed, please consider follow up with your Primary Care Provider.  If you are age 2 or younger, your body mass index should be between 19-25. Your Body mass index is 50.98 kg/m. If this is out of the aformentioned range listed, please consider follow up with your Primary Care Provider.   Thank you for choosing [facility]  Dr Amada Jupiter III

## 2017-03-03 NOTE — Progress Notes (Signed)
[facility] Consult Note:  History: Julie Fernandez 03/03/2017  Referring physician: Sebastian Ache, PA-C  Reason for consult/chief complaint: Abdominal Pain (RLQ) and Diarrhea (Everyday. Happens right after eating. Tried Pepto but does not help. Sees BRB with wiping.)   Subjective  HPI:  This is a 26 year old woman referred by primary care noted above for abdominal pain and diarrhea. It began sometime in July, and she had an ED visit on July 29 for this. Julie Fernandez describes right lower quadrant pain that is constant and not changed with meals, position, time of day or bowel movements. It radiates to the left lower quadrant and around to the back. She came to the ED on that day because the pain was much worse, she got relief from morphine and a GI cocktail. Follow-up with primary care afterwards and expressed concerns this must be her gallbladder, requesting a HIDA scan. Julie Fernandez describes multiple loose nonbloody stools per day, sometimes up to 10 in a day with no nocturnal diarrhea. She has no frank rectal bleeding, but sometimes blood on the paper due to the frequency BMs.  There is intermittent nausea but no vomiting. She denies dysphagia, odynophagia, early satiety or unexpected weight loss. She had no travel, sick contacts, antibiotic use or new medicines prior to the onset of symptoms.  ROS:  Review of Systems  Constitutional: Negative for appetite change and unexpected weight change.  HENT: Negative for mouth sores and voice change.   Eyes: Negative for pain and redness.  Respiratory: Negative for cough and shortness of breath.   Cardiovascular: Negative for chest pain and palpitations.  Genitourinary: Negative for dysuria and hematuria.  Musculoskeletal: Negative for arthralgias and myalgias.  Skin: Negative for pallor and rash.  Neurological: Negative for weakness and headaches.  Hematological: Negative for adenopathy.    Psychiatric/Behavioral: Positive for dysphoric mood. The patient is nervous/anxious.      Past Medical History: Past Medical History:  Diagnosis Date  . Allergy   . Anxiety   . Asthma   . Depression   . GERD (gastroesophageal reflux disease)   . High blood pressure   . Obesity   . Pneumonia      Past Surgical History: Past Surgical History:  Procedure Laterality Date  . BREAST SURGERY     breast reduction 2012  . EYE SURGERY     strabismus  . TONSILECTOMY, ADENOIDECTOMY, BILATERAL MYRINGOTOMY AND TUBES       Family History: Family History  Problem Relation Age of Onset  . Diabetes Paternal Grandmother     Social History: Social History   Social History  . Marital status: Single    Spouse name: N/A  . Number of children: N/A  . Years of education: N/A   Social History Main Topics  . Smoking status: Never Smoker  . Smokeless tobacco: Never Used  . Alcohol use No  . Drug use: No  . Sexual activity: No   Other Topics Concern  . None   Social History Narrative  . None    Allergies: No Known Allergies  Outpatient Meds: Current Outpatient Prescriptions  Medication Sig Dispense Refill  . albuterol (PROVENTIL HFA;VENTOLIN HFA) 108 (90 Base) MCG/ACT inhaler Inhale 2 puffs into the lungs every 4 (four) hours as needed for wheezing or shortness of breath (cough, shortness of breath or wheezing.). 1 Inhaler 1  . beclomethasone (QVAR) 40 MCG/ACT inhaler Inhale 2 puffs into the lungs 2 (two) times daily. 1 Inhaler 12  . FLUoxetine (PROZAC) 40  MG capsule Take 1 capsule (40 mg total) by mouth daily. 90 capsule 3  . norethindrone-ethinyl estradiol 1/35 (ORTHO-NOVUM, NORTREL,CYCLAFEM) tablet Take 1 tablet by mouth daily. 3 Package 4  . omeprazole (PRILOSEC) 20 MG capsule TAKE 1 CAPSULE BY MOUTH EVERY DAY 30 capsule 0  . hyoscyamine (LEVSIN SL) 0.125 MG SL tablet Place 1 tablet (0.125 mg total) under the tongue every 6 (six) hours as needed. 30 tablet 1   No  current facility-administered medications for this visit.    She takes the omeprazole occasionally for heartburn, and has done so for several years.   ___________________________________________________________________ Objective   Exam:  BP 110/84   Pulse 72   Ht  (1.626 m)   Wt 297 lb (134.7 kg)   LMP 02/25/2017   BMI 50.98 kg/m    General: this is a(n) morbidly obese young woman in no acute distress, somewhat restricted affect.   Eyes: sclera anicteric, no redness  ENT: oral mucosa moist without lesions, no cervical or supraclavicular lymphadenopathy, good dentition  CV: RRR without murmur, S1/S2, no JVD, no peripheral edema  Resp: clear to auscultation bilaterally, normal RR and effort noted  GI: soft, mild RLQ tenderness, with active bowel sounds. No guarding or palpable organomegaly noted. Exam limited by body habitus  Skin; warm and dry, no rash or jaundice noted  Neuro: awake, alert and oriented x 3. Normal gross motor function and fluent speech  Labs:  CBC Latest Ref Rng & Units 03/01/2017 01/03/2017 12/15/2016  WBC 4.0 - 10.5 K/uL 7.2 7.1 10.0  Hemoglobin 12.0 - 15.0 g/dL 09.8 11.9 14.7  Hematocrit 36.0 - 46.0 % 40.2 40.0 40.0  Platelets 150 - 400 K/uL 267 - 285   CMP Latest Ref Rng & Units 03/01/2017 01/03/2017 12/15/2016  Glucose 65 - 99 mg/dL 89 92 829(F)  BUN 6 - 20 mg/dL Creatinine 0.44 - 1.00 mg/dL 6.21 3.08 6.57  Sodium 135 - 145 mmol/L 134(L) 141 140  Potassium 3.5 - 5.1 mmol/L 3.6 4.3 4.1  Chloride 101 - 111 mmol/L 101 101 107  CO2 22 - 32 mmol/L 23 19(L) 21(L)  Calcium 8.9 - 10.3 mg/dL 9.1 9.4 9.5  Total Protein 6.5 - 8.1 g/dL 7.8 7.6 7.4  Total Bilirubin 0.3 - 1.2 mg/dL 0.5 <8.4 0.3  Alkaline Phos 38 - 126 U/L 66 79 66  AST 15 - 41 U/L 27 22 39  ALT 14 - 54 U/L 28 22 33     Radiologic Studies:  CTAP with contrast 7/29 nml [place] - prob 1cm hemangioma  Assessment: Encounter Diagnoses  Name Primary?  . RLQ abdominal pain Yes    . Chronic diarrhea   . Nausea without vomiting     The symptoms sound most like a functional bowel disorder, possibly IBD, do not sound typical of biliary colic. She requested a HIDA scan, but I do not think that is helpful test in this scenario. I explained how, if symptoms aren't typical for biliary colic, a low ejection fraction will be of uncertain clinical significance. I recommended stool studies to rule out infection, which she did not want to do. I advised upper endoscopy and colonoscopy to rule out inflammatory bowel disease or celiac sprue, and she declined. She would like to give it some more consideration, but frankly seems upset that I do not feel this is likely her gallbladder. She feels that her father had the same symptoms and his gallbladder was the culprit, and it could  not be IBS because a friend with IBS does not have nausea.  Plan:  She was agreeable to a short trial of medication, so I prescribed hyoscyamine 0.125 mg every 6 hours as needed, 30 tablets with no refills. We will wait to hear from her if she changes her mind about an endoscopic workup.  Thank you for the courtesy of this consult.  Please call me with any questions or concerns.  Charlie Pitter III  CC: McVey, Madelaine Bhat, PA-C

## 2017-03-04 ENCOUNTER — Telehealth: Payer: Self-pay | Admitting: Gastroenterology

## 2017-03-04 ENCOUNTER — Ambulatory Visit (INDEPENDENT_AMBULATORY_CARE_PROVIDER_SITE_OTHER): Payer: 59 | Admitting: Physician Assistant

## 2017-03-04 ENCOUNTER — Encounter: Payer: Self-pay | Admitting: Physician Assistant

## 2017-03-04 VITALS — BP 108/82 | HR 85 | Temp 98.1°F | Resp 16 | Ht 63.0 in | Wt 295.0 lb

## 2017-03-04 DIAGNOSIS — R0981 Nasal congestion: Secondary | ICD-10-CM | POA: Diagnosis not present

## 2017-03-04 DIAGNOSIS — J3489 Other specified disorders of nose and nasal sinuses: Secondary | ICD-10-CM

## 2017-03-04 DIAGNOSIS — R1011 Right upper quadrant pain: Secondary | ICD-10-CM

## 2017-03-04 DIAGNOSIS — J45909 Unspecified asthma, uncomplicated: Secondary | ICD-10-CM | POA: Diagnosis not present

## 2017-03-04 DIAGNOSIS — J309 Allergic rhinitis, unspecified: Secondary | ICD-10-CM | POA: Diagnosis not present

## 2017-03-04 MED ORDER — MONTELUKAST SODIUM 10 MG PO TABS: 10.0000 mg | ORAL_TABLET | Freq: Every day | ORAL | 3 refills | Status: DC

## 2017-03-04 MED ORDER — BECLOMETHASONE DIPROPIONATE 40 MCG/ACT IN AERS: 2.0000 | INHALATION_SPRAY | Freq: Two times a day (BID) | RESPIRATORY_TRACT | 12 refills | Status: DC

## 2017-03-04 NOTE — Telephone Encounter (Signed)
That would be up to Dr. Lavon Paganini to accept if she chose to do so.

## 2017-03-04 NOTE — Progress Notes (Signed)
Julie Fernandez  MRN: [id number] DOB: 1990-10-05  PCP: Sebastian Ache, PA-C  Subjective:  Pt is a 26 year old female who presents to clinic for sinus pressure and both ears hurting. Symptoms started after mowing her grass a few weeks ago. She has been taking Mucinex x 1 week. Helping some.  She no longer takes daily allergy medication. She was taking Singulair but was out of refills and never got any more.  Tried Zyrtec - "kind of worked".   H/o asthma - Needs refill of QVar. She has been out of this since she moved several months ago and lost it.  No recent asthma exacerbation.  Recently seen by GI for RUQ pain Dr. Myrtie Neither. Was denied HIDA scan. She believes she has gallbladder disease and would like a second opinion. Recently went to the emergency department for RUQ pain. Left AMA because "I was there for like 5 hours".   Review of Systems  Constitutional: Negative for chills, fatigue and fever.  HENT: Positive for congestion, postnasal drip and sinus pressure. Negative for rhinorrhea, sinus pain, sore throat and trouble swallowing.   Respiratory: Negative for cough, chest tightness, shortness of breath and wheezing.     Patient Active Problem List   Diagnosis Date Noted  . Asthma, chronic 02/13/2016  . Environmental allergies 02/13/2016    Current Outpatient Prescriptions on File Prior to Visit  Medication Sig Dispense Refill  . albuterol (PROVENTIL HFA;VENTOLIN HFA) 108 (90 Base) MCG/ACT inhaler Inhale 2 puffs into the lungs every 4 (four) hours as needed for wheezing or shortness of breath (cough, shortness of breath or wheezing.). 1 Inhaler 1  . beclomethasone (QVAR) 40 MCG/ACT inhaler Inhale 2 puffs into the lungs 2 (two) times daily. 1 Inhaler 12  . FLUoxetine (PROZAC) 40 MG capsule Take 1 capsule (40 mg total) by mouth daily. 90 capsule 3  . norethindrone-ethinyl estradiol 1/35 (ORTHO-NOVUM, NORTREL,CYCLAFEM) tablet Take 1 tablet by mouth daily. 3 Package 4  .  omeprazole (PRILOSEC) 20 MG capsule TAKE 1 CAPSULE BY MOUTH EVERY DAY 30 capsule 0  . hyoscyamine (LEVSIN SL) 0.125 MG SL tablet Place 1 tablet (0.125 mg total) under the tongue every 6 (six) hours as needed. (Patient not taking: Reported on 03/04/2017) 30 tablet 1   No current facility-administered medications on file prior to visit.     No Known Allergies   Objective:  BP 108/82 (BP Location: Right Arm, Cuff Size: Large)   Pulse 85   Temp 98.1 F (36.7 C) (Oral)   Resp 16   Ht  (1.6 m)   Wt 295 lb (133.8 kg)   LMP 02/25/2017   SpO2 97%   BMI 52.26 kg/m   Physical Exam  Constitutional: She is oriented to person, place, and time and well-developed, well-nourished, and in no distress. No distress.  HENT:  Right Ear: Tympanic membrane normal.  Left Ear: Tympanic membrane normal.  Nose: Mucosal edema present. No rhinorrhea. Right sinus exhibits frontal sinus tenderness. Right sinus exhibits no maxillary sinus tenderness. Left sinus exhibits frontal sinus tenderness. Left sinus exhibits no maxillary sinus tenderness.  Mouth/Throat: Mucous membranes are normal. Posterior oropharyngeal edema present. No oropharyngeal exudate or posterior oropharyngeal erythema.  Cardiovascular: Normal rate, regular rhythm and normal heart sounds.   Pulmonary/Chest: Effort normal and breath sounds normal.  Neurological: She is alert and oriented to person, place, and time. GCS score is 15.  Skin: Skin is warm and dry.  Psychiatric: Mood, memory, affect and judgment normal.  Vitals reviewed.   Assessment and Plan :  1. Sinus pressure 2. Allergic rhinitis, unspecified seasonality, unspecified trigger 3. Nasal congestion - montelukast (SINGULAIR) 10 MG tablet; Take 1 tablet (10 mg total) by mouth at bedtime.  Dispense: 90 tablet; Refill: 3 - Advised daily antihistamine use. RTC in 7-10 days if no relief.  4. Chronic asthma without complication, unspecified asthma severity, unspecified whether  persistent - beclomethasone (QVAR) 40 MCG/ACT inhaler; Inhale 2 puffs into the lungs 2 (two) times daily.  Dispense: 1 Inhaler; Refill: 12  5. RUQ pain - Ambulatory referral to Gastroenterology - Pt would like second opinion.   Marco Collie, PA-C  Primary [facility] at [facility] 03/04/2017 12:29 PM

## 2017-03-04 NOTE — Patient Instructions (Signed)
     IF you received an x-ray today, you will receive an invoice from [facility]. Please contact [facility] at [phone] with questions or concerns regarding your invoice.   IF you received labwork today, you will receive an invoice from [facility]. Please contact [facility] at [phone] with questions or concerns regarding your invoice.   Our billing staff will not be able to assist you with questions regarding bills from these companies.  You will be contacted with the lab results as soon as they are available. The fastest way to get your results is to activate your My Chart account. Instructions are located on the last page of this paperwork. If you have not heard from us regarding the results in 2 weeks, please contact this office.     

## 2017-03-04 NOTE — Telephone Encounter (Signed)
Patient would like to switch to a female provider, Dr. Myrtie Neither is okay with this. Please let us know if you will accept this patient.

## 2017-03-04 NOTE — Telephone Encounter (Signed)
Routed to Dr. Danis. 

## 2017-03-04 NOTE — Telephone Encounter (Signed)
I reviewed chart and patient was offered appropriate work up which patient declined.  Sorry, cannot accept the transfer, I don't have any opening for next 2 months.

## 2017-03-05 NOTE — Telephone Encounter (Signed)
Please let the patient know, not sure what happens next. It is my understanding that she will need to find a provider at another office.

## 2017-03-06 ENCOUNTER — Telehealth: Payer: Self-pay | Admitting: Family Medicine

## 2017-03-06 ENCOUNTER — Other Ambulatory Visit: Payer: Self-pay

## 2017-03-06 NOTE — Telephone Encounter (Signed)
[facility] called to let Julie Fernandez know that they don't make beclomethasone(Qvar) anymore they would like a verbal order to change prescription to either Qvar ready inhaler or flovent

## 2017-03-06 NOTE — Telephone Encounter (Signed)
Informed patient of this and patient has declined to schedule.

## 2017-03-10 ENCOUNTER — Telehealth: Payer: Self-pay | Admitting: Physician Assistant

## 2017-03-10 NOTE — Telephone Encounter (Signed)
Done

## 2017-03-10 NOTE — Telephone Encounter (Signed)
Pt called requesting to be switched to a different GI doctor. Pt formerly seen at [facility]. I called pt and left a message to call me back. Could not leave detailed msg but need to know where pt would like to be switched to. Thanks!

## 2017-03-12 ENCOUNTER — Other Ambulatory Visit: Payer: Self-pay | Admitting: Physician Assistant

## 2017-03-12 DIAGNOSIS — J45909 Unspecified asthma, uncomplicated: Secondary | ICD-10-CM

## 2017-03-12 MED ORDER — FLUTICASONE PROPIONATE (INHAL) 50 MCG/BLIST IN AEPB: 1.0000 | INHALATION_SPRAY | Freq: Two times a day (BID) | RESPIRATORY_TRACT | 12 refills | Status: DC

## 2017-04-18 ENCOUNTER — Other Ambulatory Visit: Payer: Self-pay | Admitting: Physician Assistant

## 2017-04-18 DIAGNOSIS — N938 Other specified abnormal uterine and vaginal bleeding: Secondary | ICD-10-CM

## 2017-04-19 ENCOUNTER — Ambulatory Visit: Payer: 59 | Admitting: Physician Assistant

## 2017-05-12 ENCOUNTER — Ambulatory Visit: Payer: 59 | Admitting: Physician Assistant

## 2017-10-03 ENCOUNTER — Encounter: Payer: Self-pay | Admitting: Family Medicine

## 2017-10-08 ENCOUNTER — Encounter: Payer: Self-pay | Admitting: Family Medicine

## 2018-02-26 ENCOUNTER — Other Ambulatory Visit: Payer: Self-pay

## 2018-02-26 ENCOUNTER — Encounter (HOSPITAL_COMMUNITY): Payer: Self-pay | Admitting: *Deleted

## 2018-02-26 ENCOUNTER — Emergency Department (HOSPITAL_COMMUNITY)
Admission: EM | Admit: 2018-02-26 | Discharge: 2018-02-26 | Disposition: A | Discharge status: Home / Self Care | Payer: 59 | Attending: Emergency Medicine | Admitting: Emergency Medicine

## 2018-02-26 DIAGNOSIS — R519 Headache, unspecified: Secondary | ICD-10-CM

## 2018-02-26 DIAGNOSIS — R51 Headache: Secondary | ICD-10-CM | POA: Insufficient documentation

## 2018-02-26 DIAGNOSIS — J45909 Unspecified asthma, uncomplicated: Secondary | ICD-10-CM | POA: Insufficient documentation

## 2018-02-26 DIAGNOSIS — Z79899 Other long term (current) drug therapy: Secondary | ICD-10-CM | POA: Insufficient documentation

## 2018-02-26 LAB — I-STAT BETA HCG BLOOD, ED (MC, WL, AP ONLY): I-stat hCG, quantitative: <5 m[IU]/mL (ref <5)

## 2018-02-26 MED ORDER — DIPHENHYDRAMINE HCL 50 MG/ML IJ SOLN
25.0000 mg | Freq: Once | INTRAMUSCULAR | Status: AC
  Administered 2018-02-26: 25 mg via INTRAVENOUS
  Filled 2018-02-26: qty 1

## 2018-02-26 MED ORDER — KETOROLAC TROMETHAMINE 30 MG/ML IJ SOLN
30.0000 mg | Freq: Once | INTRAMUSCULAR | Status: AC
  Administered 2018-02-26: 30 mg via INTRAVENOUS
  Filled 2018-02-26: qty 1

## 2018-02-26 MED ORDER — PROCHLORPERAZINE EDISYLATE 10 MG/2ML IJ SOLN
10.0000 mg | Freq: Once | INTRAMUSCULAR | Status: AC
  Administered 2018-02-26: 10 mg via INTRAVENOUS
  Filled 2018-02-26: qty 2

## 2018-02-26 NOTE — ED Triage Notes (Signed)
The npt is c/o a headaxche and elevated bp tonight nauseated.  lmp 4 weeks ago

## 2018-02-26 NOTE — ED Notes (Signed)
Ok per [employer] for pt to have something to drink. Pt requested ginger ale and pt given the same

## 2018-02-26 NOTE — ED Provider Notes (Signed)
[facility] EMERGENCY DEPARTMENT Provider Note   CSN: [id number] Arrival date & time: 02/26/18  4098     History   Chief Complaint Chief Complaint  Patient presents with  . Headache    HPI Julie Fernandez is a 27 y.o. female.  Patient presents to the ER for evaluation of headache.  Patient reports that she has been experiencing dizziness and headache.  Dizziness is at times a sense of spinning.  No visual disturbance.  No light sensitivity.  No nausea, vomiting.  Patient reports that her mother took her blood pressure and it was 180/120.  She reports no history of hypertension, is not on any blood pressure medications.     Past Medical History:  Diagnosis Date  . Allergy   . Anxiety   . Asthma   . Depression   . GERD (gastroesophageal reflux disease)   . High blood pressure   . Obesity   . Pneumonia     Patient Active Problem List   Diagnosis Date Noted  . Asthma, chronic 02/13/2016  . Environmental allergies 02/13/2016    Past Surgical History:  Procedure Laterality Date  . BREAST SURGERY     breast reduction 2012  . EYE SURGERY     strabismus  . TONSILECTOMY, ADENOIDECTOMY, BILATERAL MYRINGOTOMY AND TUBES       OB History   None      Home Medications    Prior to Admission medications   Medication Sig Start Date End Date Taking? Authorizing Provider  albuterol (PROVENTIL HFA;VENTOLIN HFA) 108 (90 Base) MCG/ACT inhaler Inhale 2 puffs into the lungs every 4 (four) hours as needed for wheezing or shortness of breath (cough, shortness of breath or wheezing.). 08/22/15  Yes Emi Belfast, FNP  fluticasone (FLOVENT DISKUS) 50 MCG/BLIST diskus inhaler Inhale 1 puff into the lungs 2 (two) times daily. Patient taking differently: Inhale 1 puff into the lungs 2 (two) times daily as needed (asthma).  03/12/17  Yes McVey, Madelaine Bhat, PA-C  montelukast (SINGULAIR) 10 MG tablet Take 1 tablet (10 mg total) by mouth at bedtime. Patient  taking differently: Take 10 mg by mouth daily as needed (asthma).  03/04/17  Yes McVey, Madelaine Bhat, PA-C  NORTREL 1/35, 28, tablet TAKE 1 TABLET BY MOUTH DAILY. 04/21/17  Yes McVey, Madelaine Bhat, PA-C  omeprazole (PRILOSEC) 20 MG capsule TAKE 1 CAPSULE BY MOUTH EVERY DAY Patient taking differently: Take 20 mg by mouth daily.  12/13/16  Yes McVey, Madelaine Bhat, PA-C  beclomethasone (QVAR) 40 MCG/ACT inhaler Inhale 2 puffs into the lungs 2 (two) times daily. Patient not taking: Reported on 02/26/2018 03/04/17   McVey, Madelaine Bhat, PA-C  FLUoxetine (PROZAC) 40 MG capsule Take 1 capsule (40 mg total) by mouth daily. Patient not taking: Reported on 02/26/2018 08/06/16   McVey, Madelaine Bhat, PA-C  hyoscyamine (LEVSIN SL) 0.125 MG SL tablet Place 1 tablet (0.125 mg total) under the tongue every 6 (six) hours as needed. Patient not taking: Reported on 03/04/2017 03/03/17   Sherrilyn Rist, MD    Family History Family History  Problem Relation Age of Onset  . Diabetes Paternal Grandmother     Social History Social History   Tobacco Use  . Smoking status: Never Smoker  . Smokeless tobacco: Never Used  Substance Use Topics  . Alcohol use: No  . Drug use: No     Allergies   Patient has no known allergies.   Review of Systems Review of Systems  Neurological: Positive for dizziness and headaches.  All other systems reviewed and are negative.    Physical Exam Updated Vital Signs BP 121/75   Pulse (!) 102   Temp 97.9 F (36.6 C) (Oral)   Resp 20   Ht 5\' 4"  (1.626 m)   Wt 136.1 kg   LMP 01/27/2018   SpO2 100%   BMI 51.49 kg/m   Physical Exam  Constitutional: She is oriented to person, place, and time. She appears well-developed and well-nourished. No distress.  HENT:  Head: Normocephalic and atraumatic.  Right Ear: Hearing normal.  Left Ear: Hearing normal.  Nose: Nose normal.  Mouth/Throat: Oropharynx is clear and moist and mucous membranes  are normal.  Eyes: Pupils are equal, round, and reactive to light. Conjunctivae and EOM are normal.  Neck: Normal range of motion. Neck supple.  Cardiovascular: Regular rhythm, S1 normal and S2 normal. Exam reveals no gallop and no friction rub.  No murmur heard. Pulmonary/Chest: Effort normal and breath sounds normal. No respiratory distress. She exhibits no tenderness.  Abdominal: Soft. Normal appearance and bowel sounds are normal. There is no hepatosplenomegaly. There is no tenderness. There is no rebound, no guarding, no tenderness at McBurney's point and negative Murphy's sign. No hernia.  Musculoskeletal: Normal range of motion.  Neurological: She is alert and oriented to person, place, and time. She has normal strength. No cranial nerve deficit or sensory deficit. Coordination normal. GCS eye subscore is 4. GCS verbal subscore is 5. GCS motor subscore is 6.  Skin: Skin is warm, dry and intact. No rash noted. No cyanosis.  Psychiatric: She has a normal mood and affect. Her speech is normal and behavior is normal. Thought content normal.  Nursing note and vitals reviewed.    ED Treatments / Results  Labs (all labs ordered are listed, but only abnormal results are displayed) Labs Reviewed  I-STAT BETA HCG BLOOD, ED ([facility], [facility], [facility] ONLY)    EKG None  Radiology No results found.  Procedures Procedures (including critical care time)  Medications Ordered in ED Medications  ketorolac (TORADOL) 30 MG/ML injection 30 mg (30 mg Intravenous Given 02/26/18 0656)  prochlorperazine (COMPAZINE) injection 10 mg (10 mg Intravenous Given 02/26/18 0656)  diphenhydrAMINE (BENADRYL) injection 25 mg (25 mg Intravenous Given 02/26/18 0656)     Initial Impression / Assessment and Plan / ED Course  I have reviewed the triage vital signs and the nursing notes.  Pertinent labs & imaging results that were available during my care of the patient were reviewed by me and considered in my medical  decision making (see chart for details).     Patient presents to the emergency department for evaluation of headache and dizziness.  She reports that she does not frequently have headaches.  She has a normal neurologic exam, no focal neurologic deficits on examination.  She reported significant hypertension at home, however, her blood pressure has been normal here in the ER.  She does not have a history of hypertension, cannot be certain that her cuff at home was measuring accurately.  She does not require initiation of blood pressure medications based on her blood pressures here in the ER, will refer back to primary care for repeat evaluation.  Based on her unusual headache, I did recommend work-up.  This included blood work and CT scan.  Patient was administered a migraine cocktail including Toradol, Compazine, Benadryl.  After she received these medications, her headache and dizziness completely resolved.  She reports that she is  back to her normal self without any symptoms currently and does not wish to stay in the ER for further evaluation.  She understands that although it is very reassuring that she got better with the migraine cocktail, we have not performed diagnostics and she could still have a significant and even life-threatening condition.  She will refer to neurology for evaluation of her headaches, was counseled that she can return to the ER anytime if she has recurrence of her headache or any worsening symptoms.  Final Clinical Impressions(s) / ED Diagnoses   Final diagnoses:  Bad headache    ED Discharge Orders    None       Gilda Crease, MD 02/26/18 [phone]

## 2018-04-08 ENCOUNTER — Other Ambulatory Visit: Payer: Self-pay | Admitting: Physician Assistant

## 2018-04-08 DIAGNOSIS — N938 Other specified abnormal uterine and vaginal bleeding: Secondary | ICD-10-CM

## 2018-04-08 NOTE — Telephone Encounter (Signed)
Courtesy refill  

## 2018-05-03 IMAGING — CR DG KNEE COMPLETE 4+V*R*
4 series · 4 of 4 positions shown · non-contrast
Comparison: None.

CLINICAL DATA: Fell down stairs last night. RIGHT knee pain and
abrasion.

EXAM:
RIGHT KNEE - COMPLETE 4+ VIEW

[t knee ap right]
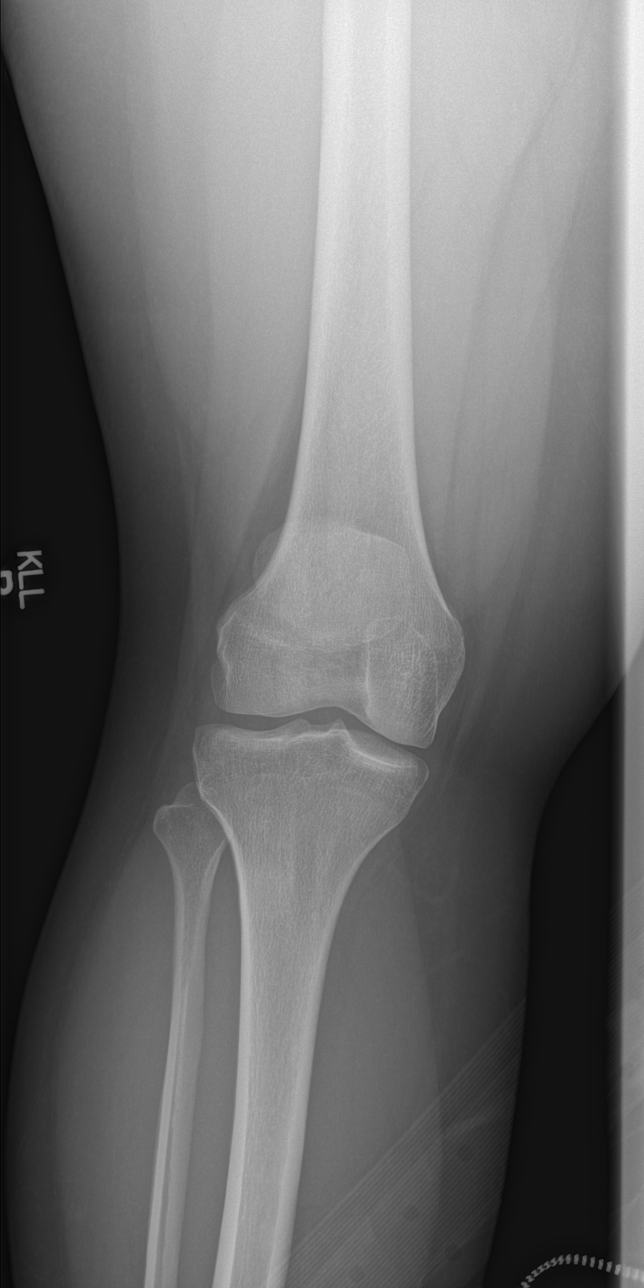

[t knee obl right (1 of 2)]
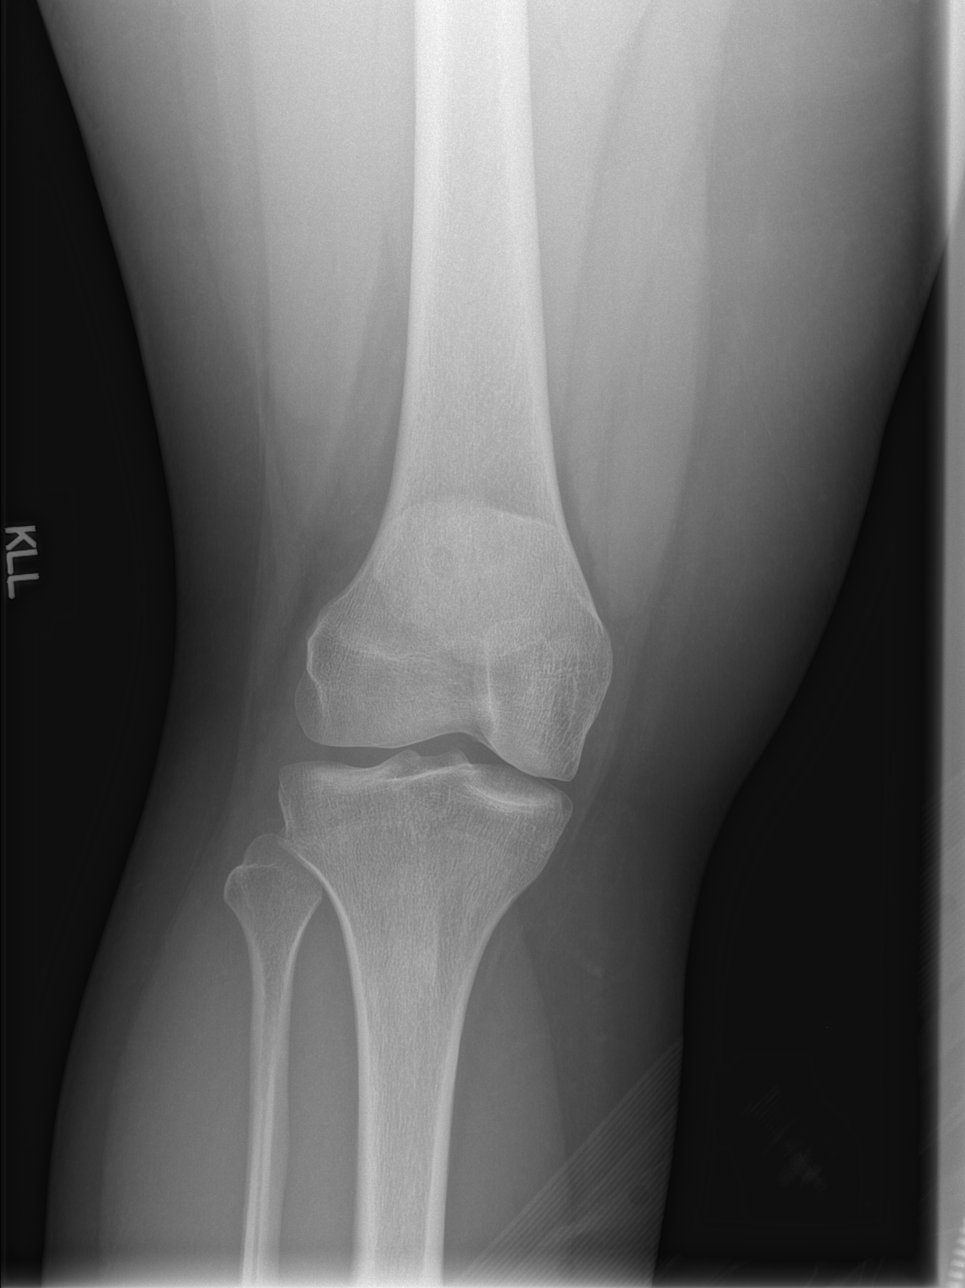

[t knee obl right (2 of 2)]
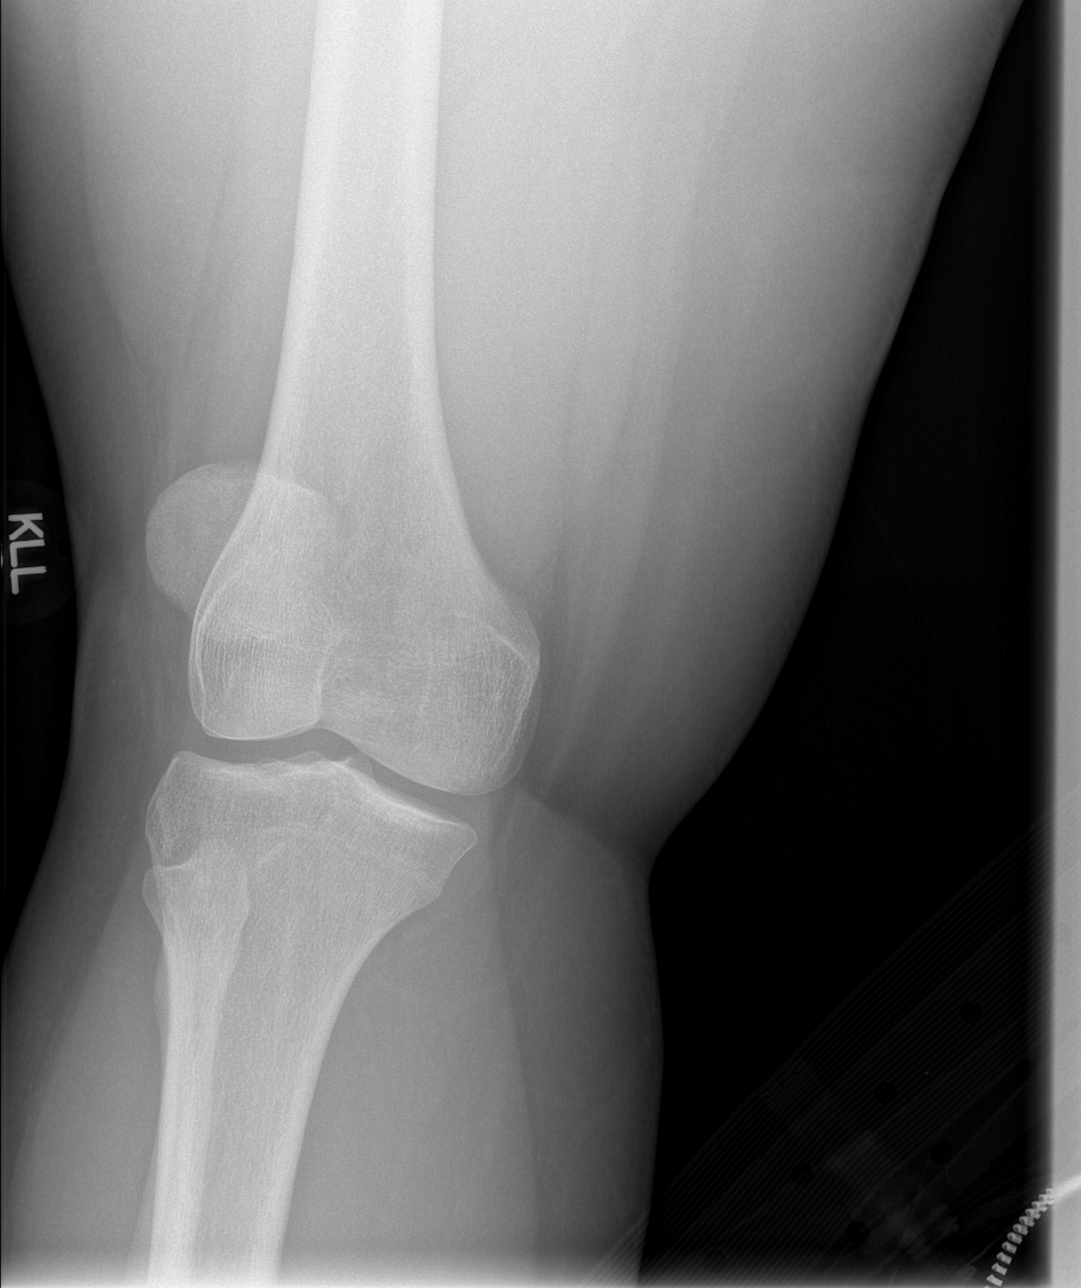

[t knee lat right]
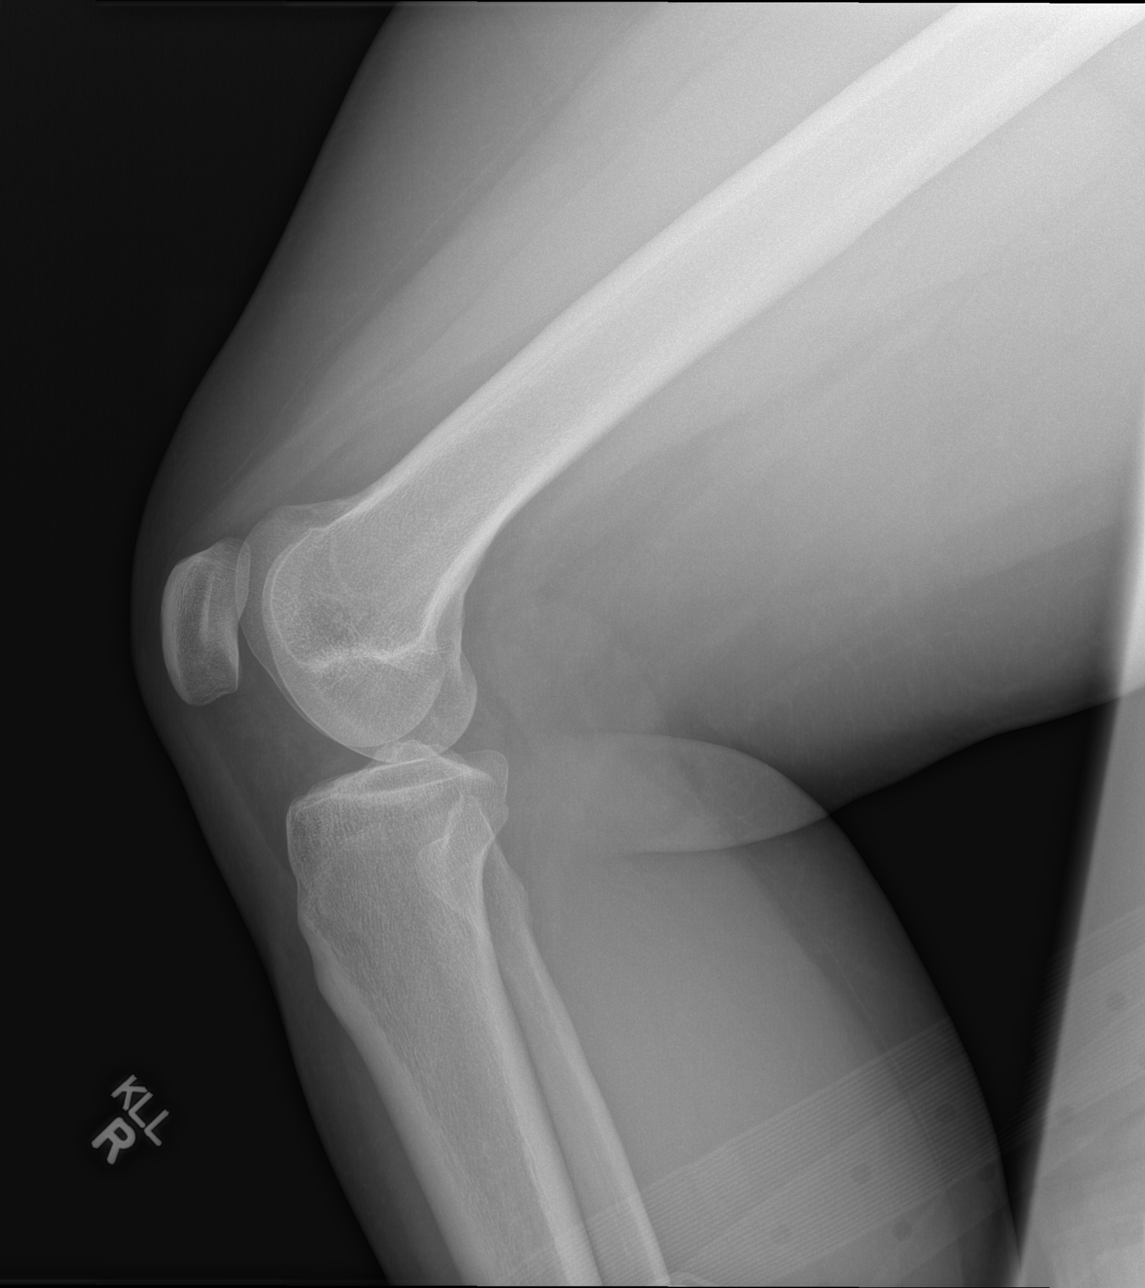

[4 of 4 positions shown; findings below may reference images not displayed]

FINDINGS: No evidence of fracture, dislocation, or joint effusion. No evidence
of arthropathy or other focal bone abnormality. Soft tissues are
unremarkable.
IMPRESSION: Negative.

## 2018-05-03 IMAGING — CR DG CLAVICLE*L*
2 series · 2 of 2 positions shown · non-contrast
Comparison: No priors.

CLINICAL DATA: 25-year-old female with history of trauma from a
fall complaining of pain in the left shoulder.

EXAM:
LEFT CLAVICLE - 2+ VIEWS

[w clavicle ap left]
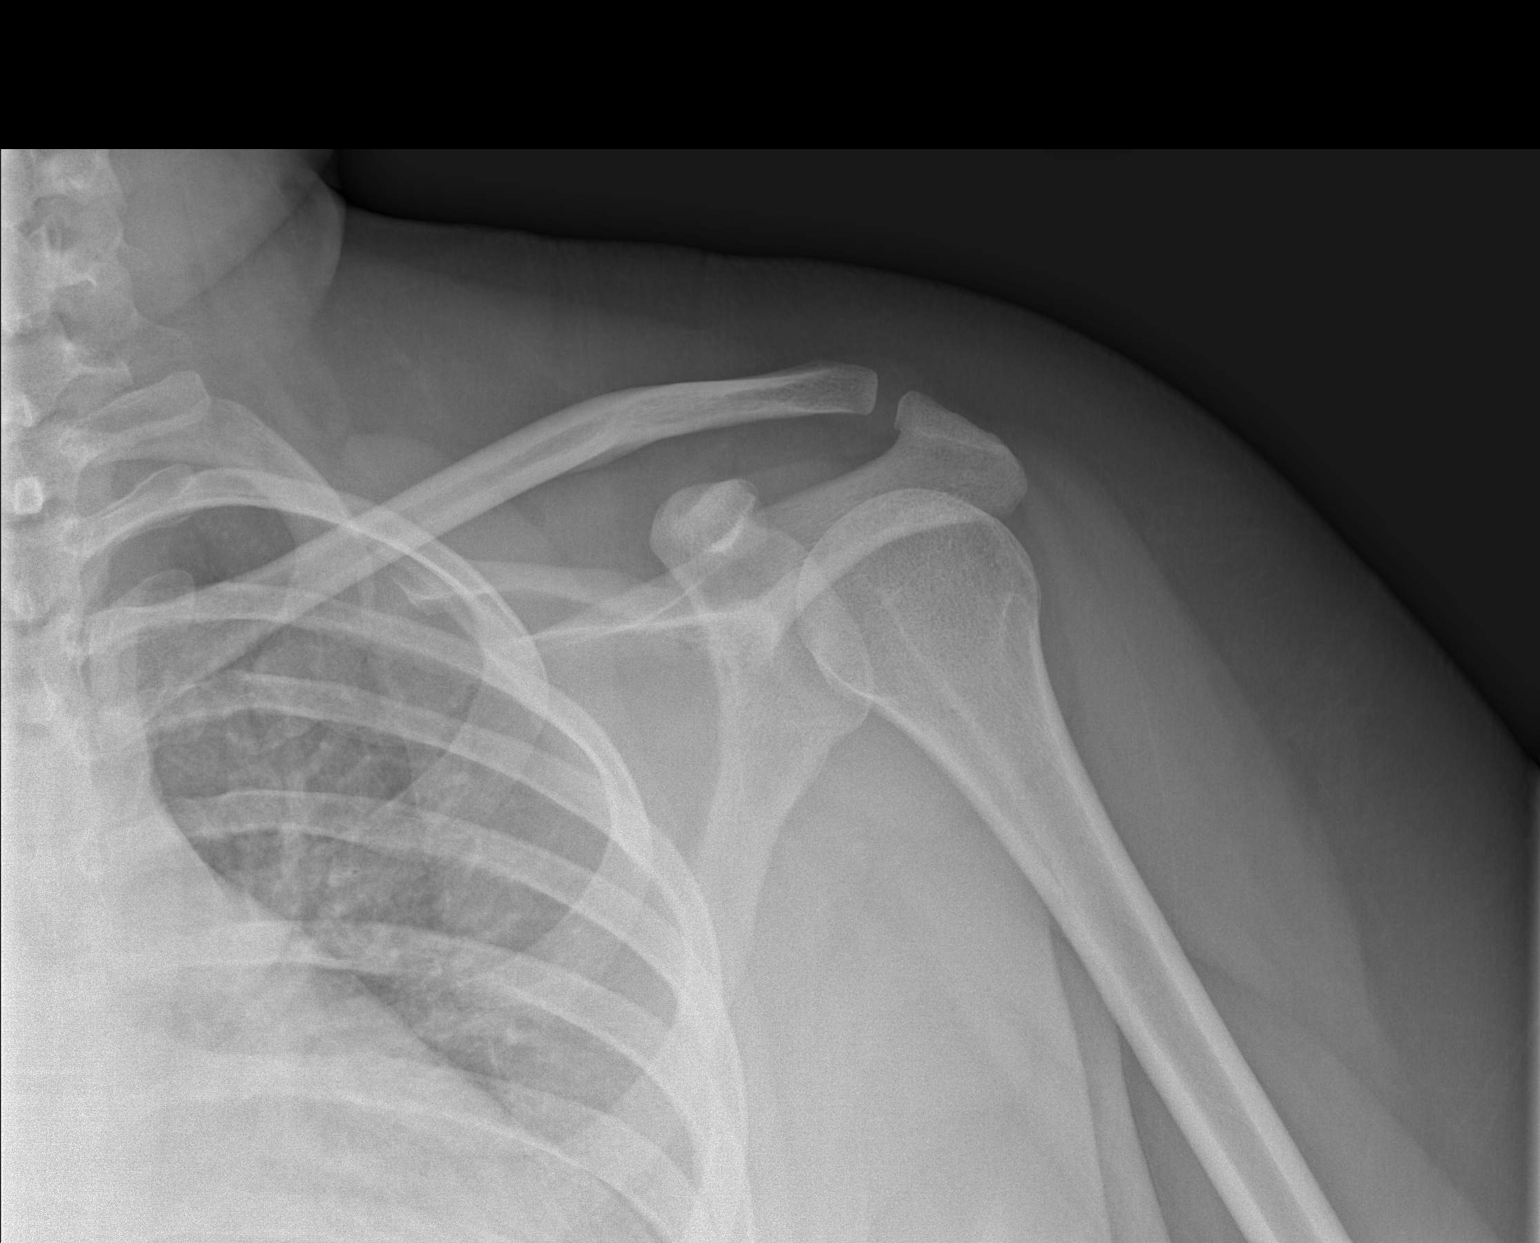

[w clavicle tangential left]
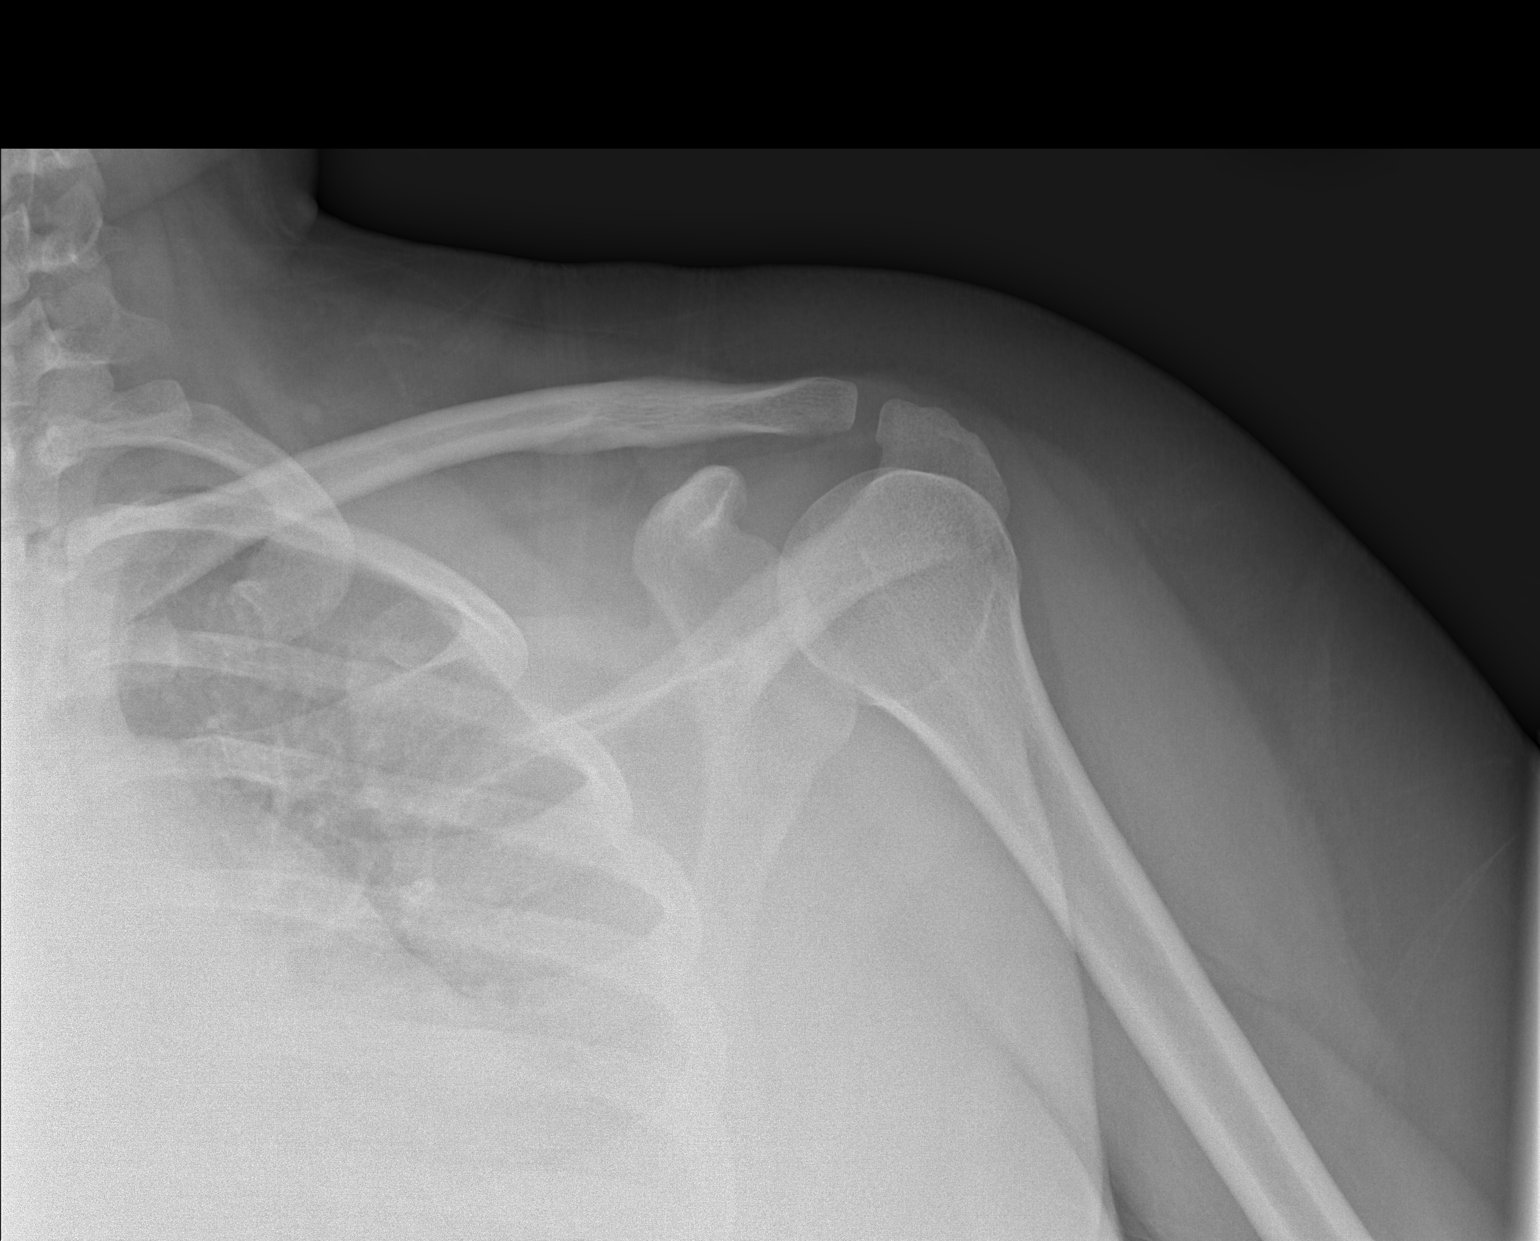

[2 of 2 positions shown; findings below may reference images not displayed]

FINDINGS: There is no evidence of fracture or other focal bone lesions. Soft
tissues are unremarkable.
IMPRESSION: Negative.

## 2018-05-03 IMAGING — CR DG HUMERUS 2V *L*
2 series · 2 of 2 positions shown · non-contrast
Comparison: None.

CLINICAL DATA: 25-year-old female with history of trauma after
falling down a flight of stairs earlier tonight.

EXAM:
LEFT HUMERUS - 2+ VIEW

[w humerus ap left]
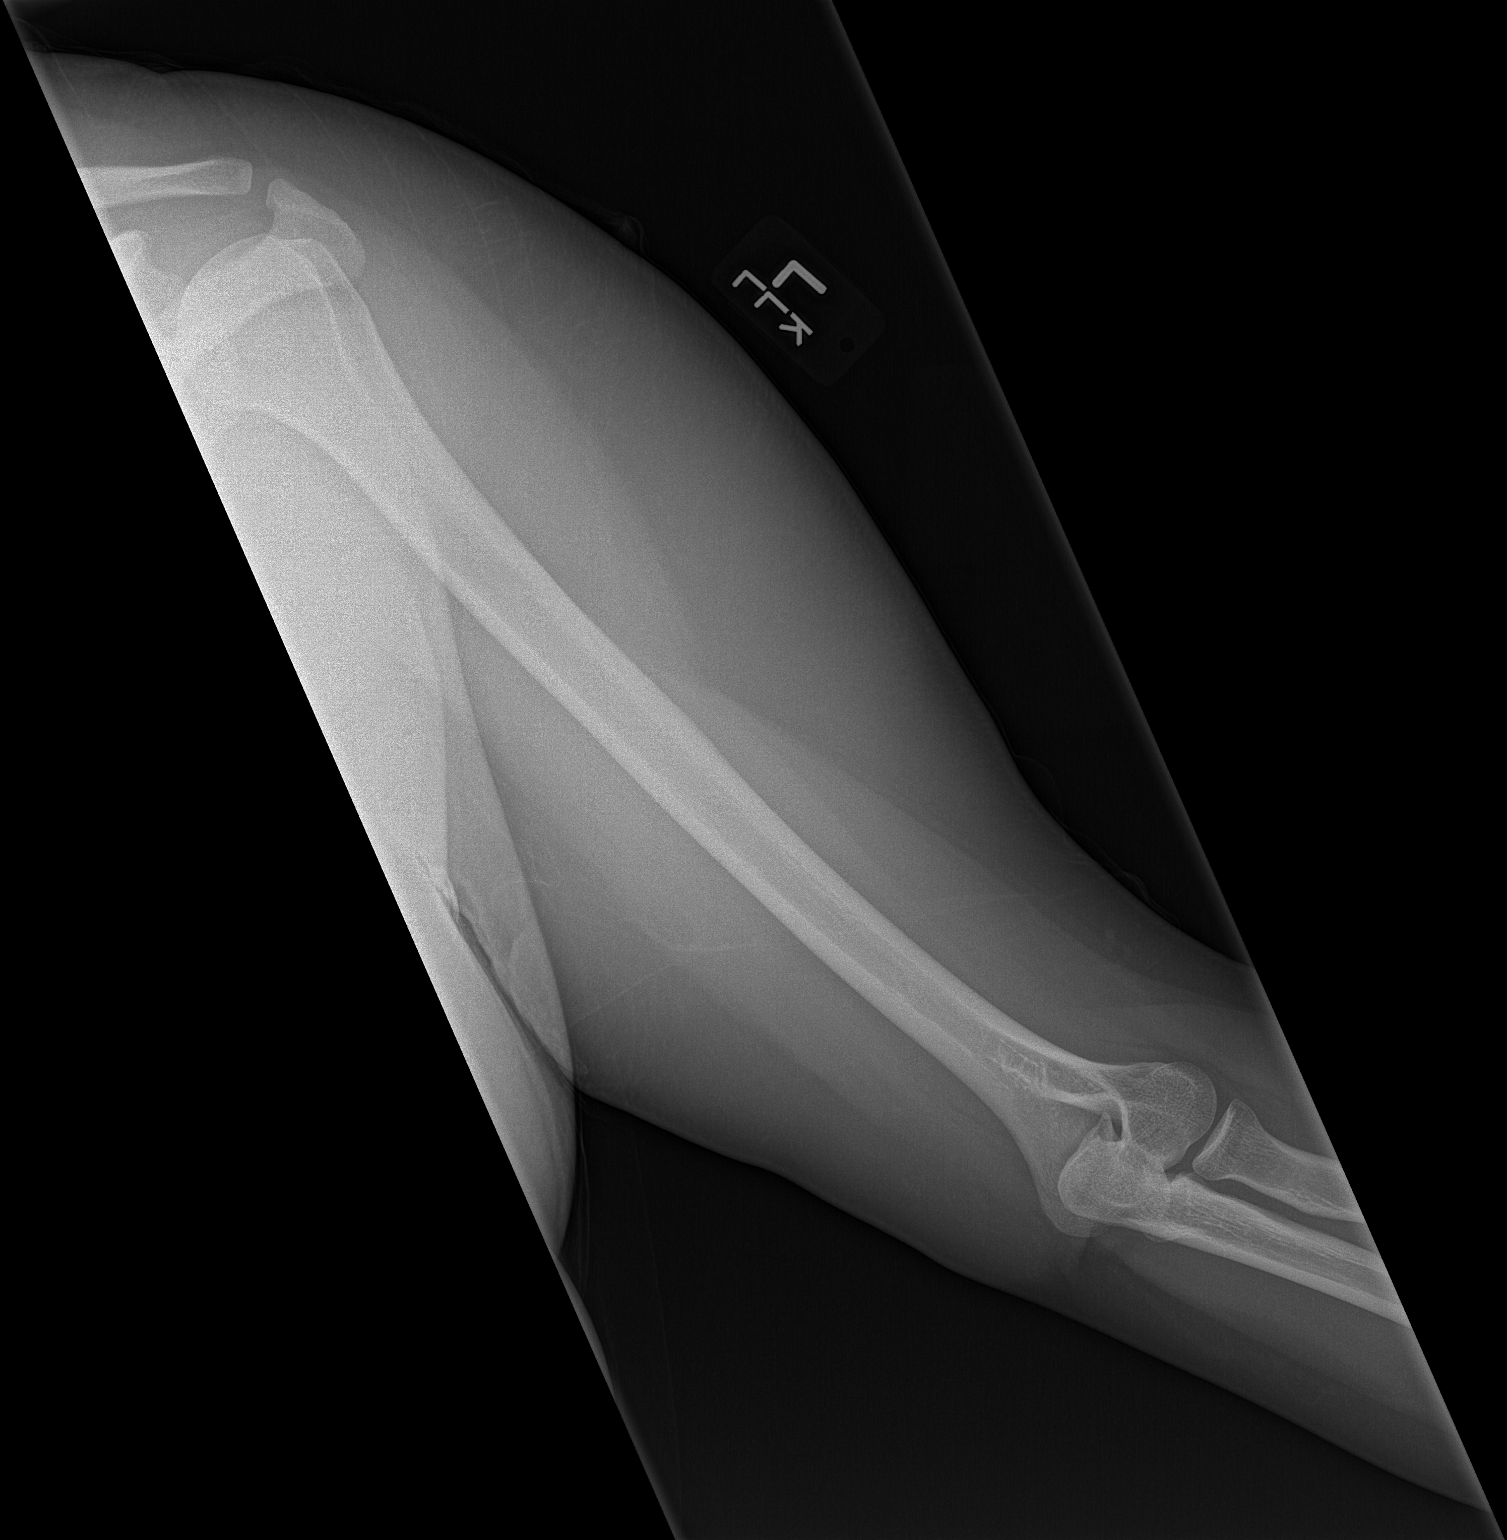

[w humerus lat left]
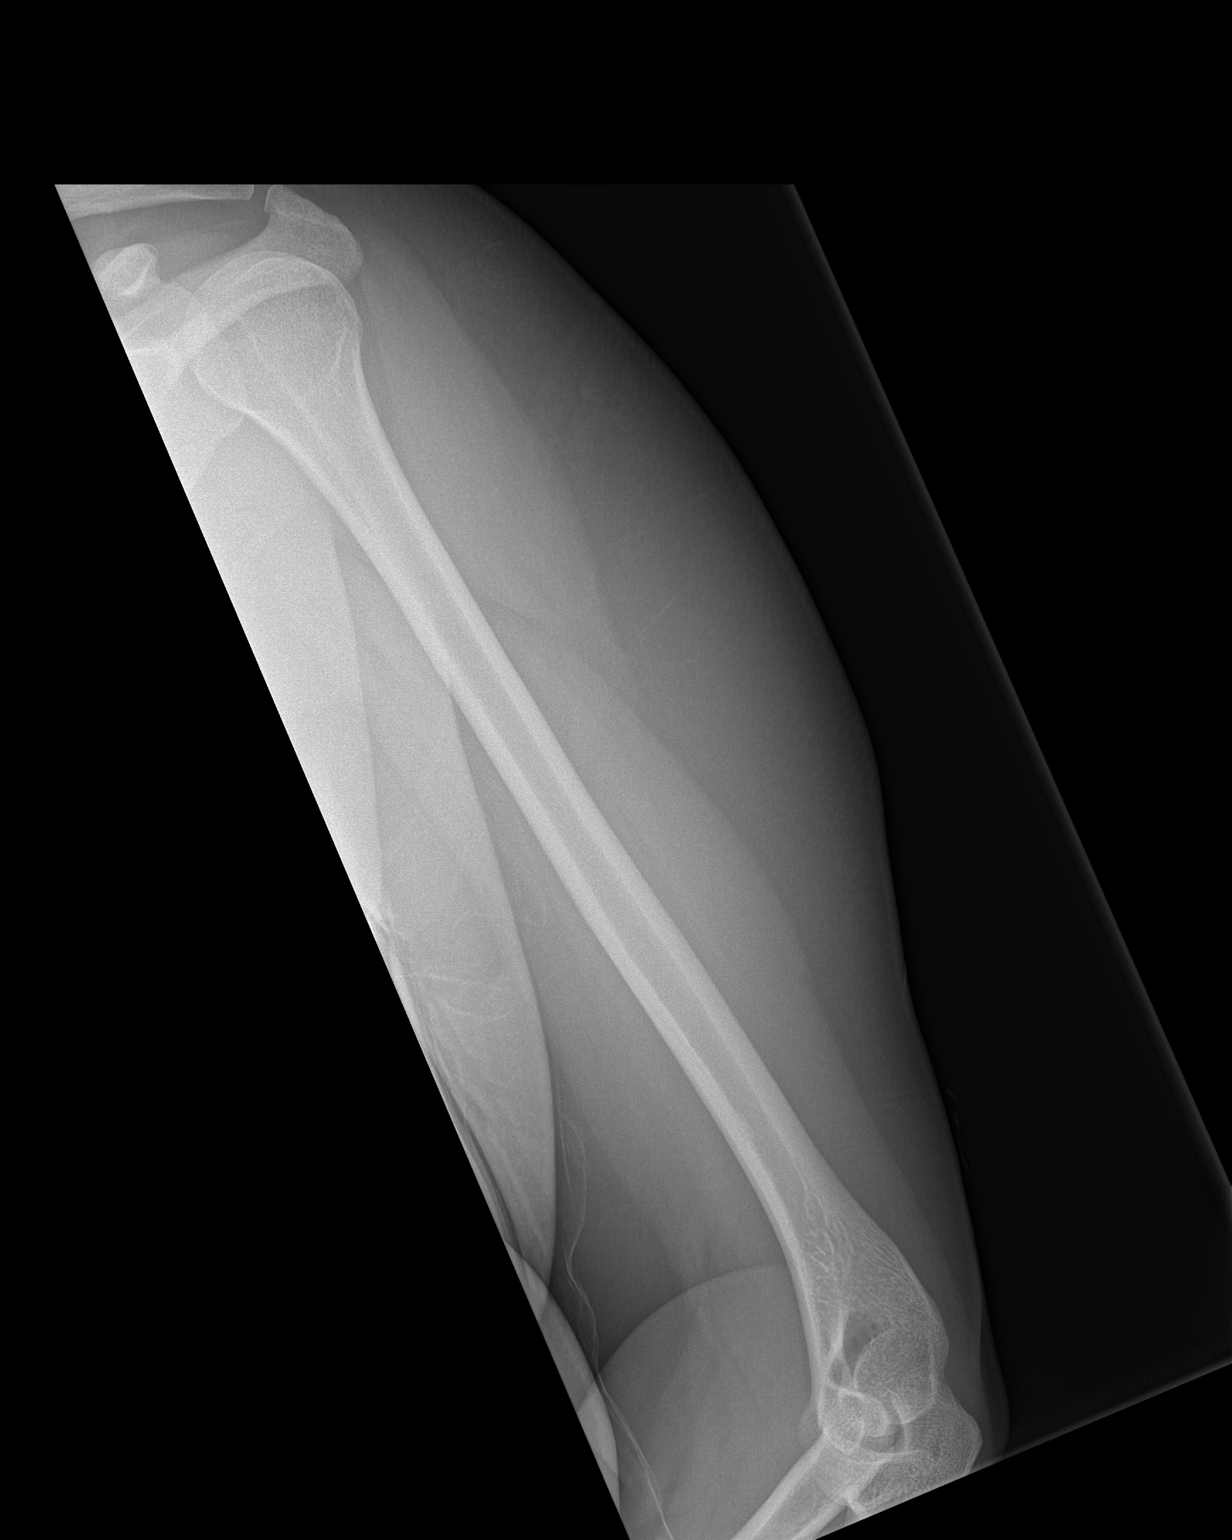

[2 of 2 positions shown; findings below may reference images not displayed]

FINDINGS: There is no evidence of fracture or other focal bone lesions. Soft
tissues are unremarkable.
IMPRESSION: Negative.

## 2018-05-09 ENCOUNTER — Other Ambulatory Visit: Payer: Self-pay | Admitting: Physician Assistant

## 2018-05-09 DIAGNOSIS — N938 Other specified abnormal uterine and vaginal bleeding: Secondary | ICD-10-CM

## 2018-05-15 ENCOUNTER — Encounter: Payer: Self-pay | Admitting: Internal Medicine

## 2018-05-15 ENCOUNTER — Ambulatory Visit: Payer: Self-pay | Admitting: Internal Medicine

## 2018-05-15 VITALS — BP 122/72 | HR 72 | Resp 12 | Ht 63.0 in | Wt 284.0 lb

## 2018-05-15 DIAGNOSIS — R1011 Right upper quadrant pain: Secondary | ICD-10-CM

## 2018-05-15 DIAGNOSIS — J45909 Unspecified asthma, uncomplicated: Secondary | ICD-10-CM

## 2018-05-15 DIAGNOSIS — Z9109 Other allergy status, other than to drugs and biological substances: Secondary | ICD-10-CM

## 2018-05-15 DIAGNOSIS — R0981 Nasal congestion: Secondary | ICD-10-CM

## 2018-05-15 DIAGNOSIS — N938 Other specified abnormal uterine and vaginal bleeding: Secondary | ICD-10-CM

## 2018-05-15 DIAGNOSIS — F339 Major depressive disorder, recurrent, unspecified: Secondary | ICD-10-CM

## 2018-05-15 DIAGNOSIS — J4521 Mild intermittent asthma with (acute) exacerbation: Secondary | ICD-10-CM

## 2018-05-15 MED ORDER — BECLOMETHASONE DIPROPIONATE 40 MCG/ACT IN AERS: 2.0000 | INHALATION_SPRAY | Freq: Two times a day (BID) | RESPIRATORY_TRACT | 12 refills | Status: DC

## 2018-05-15 MED ORDER — OMEPRAZOLE 20 MG PO CPDR: DELAYED_RELEASE_CAPSULE | ORAL | 11 refills | Status: AC

## 2018-05-15 MED ORDER — BUPROPION HCL ER (SR) 150 MG PO TB12: ORAL_TABLET | ORAL | 2 refills | Status: DC

## 2018-05-15 MED ORDER — MONTELUKAST SODIUM 10 MG PO TABS: 10.0000 mg | ORAL_TABLET | Freq: Every day | ORAL | 3 refills | Status: DC

## 2018-05-15 MED ORDER — NORETHINDRONE-ETH ESTRADIOL 1-35 MG-MCG PO TABS: 1.0000 | ORAL_TABLET | Freq: Every day | ORAL | 3 refills | Status: DC

## 2018-05-15 MED ORDER — CLONAZEPAM 1 MG PO TABS
ORAL_TABLET | ORAL | 0 refills | Status: DC
Start: 2018-05-15 — End: 2019-04-29

## 2018-05-15 MED ORDER — ALBUTEROL SULFATE HFA 108 (90 BASE) MCG/ACT IN AERS: 2.0000 | INHALATION_SPRAY | RESPIRATORY_TRACT | 1 refills | Status: DC | PRN

## 2018-05-15 NOTE — Progress Notes (Signed)
Subjective:    Patient ID: Julie Fernandez, female    DOB: 08-11-1990, 27 y.o.   MRN: [id number]  HPI   Here to establish  1.  Right side abdominal swelling for 1 year:  Was [facility] and ultimately sent to GI, who reportedly treated her for IBS, which she states did not help.   CT, plain film and Ultrasound of abdomen in 11/2016 through 12/2016 were all normal. CBC, CMP normal as well.  She was asked to perform stool studies/cultures, but appears she did not want to do those at the time.  She states she was never asked. She was recommended to undergo colonoscopy and EGD, but she refused.  Patient states she does not recall the EGD recommendations, but when the time came to do the colonoscopy, she was out of insurance and did not want to proceed.   She was treated with Levsin, the name of which she does not recall, but states all the meds tried did not help. Does not recall increasing fiber in her diet.   She states she has lost 20 lb since her symptoms started, but her weight is only 5 lbs less today than what it was about 4 months prior to her symptoms starting, though last fall gained up to 297 lbs.  Describes her GI symptoms started with diarrhea and nausea.  States had the diarrhea throughout the day.  Eating would make it worse.  States after eating, she would develop nausea and about 30 minutes later, would have diarrhea.  She can have BRBPR, but no hematochezia or melena.  Spicy, greasy food and tomato based foods tend to make this worse.  She also has GERD and states all of the same things listed above trigger her GERD. Would also develop sharp pains in RUQ with radiation down right side.   Feels bloated all the time in right abdomen. No associated fevers.   She also carries a diagnosis of GERD, for which she takes Prilosec as needed.  She is purchasing OTC currently.and taking every other day. She does not note a difference with her symptoms on the day she does not take a PPI.      She denies any new medication at the time.  Does not recall a new diet.     2.  Birth control/BCPs:  Has been on Alaycen 1/35 or similar for the past 5 years for dysfunctional uterine bleeding.  No family history of clotting disorder.  She has never smoked.   She did have what appeared to be a very small hemangioma in her right liver lobe on ultrasound.  This was not seen on CT of abdomen with and without contrast, however. Has had one normal pap smear in the past, she thinks 9 years ago..  She is not sexually active.    3.  Depression:  Was taking Fluoxetine 40 mg daily.  Worked well for her, she stated initially, but a couple of months before she took herself off the med, she was having suicidal thoughts more often and did not feel well.  When she took herself off, she felt better.   Has been off Fluoxetine for about 8 months. Feels anxious.  Feels like crying all the time.  These symptoms started about 4-5 months ago. Has not had any formal counseling Mother with depression.  She has never taken anything else besides Fluoxetine.   No history of seizures.  4.  Allergies and asthma:  Not taking any of her  meds.  Qvar 40 mcg 2 puffs twice daily--out of this.  Does have her rescue inhaler.   5.  Poor sleep and sleep hygiene.  Current Meds  Medication Sig  . albuterol (PROVENTIL HFA;VENTOLIN HFA) 108 (90 Base) MCG/ACT inhaler Inhale 2 puffs into the lungs every 4 (four) hours as needed for wheezing or shortness of breath (cough, shortness of breath or wheezing.).  Marland Kitchen. norethindrone-ethinyl estradiol 1/35 (ALAYCEN 1/35) tablet Take 1 tablet by mouth daily.  Marland Kitchen. omeprazole (PRILOSEC) 20 MG capsule 1 cap by mouth 1 hour before breakfast daily.  . [DISCONTINUED] ALAYCEN 1/35 tablet TAKE 1 TABLET BY MOUTH EVERY DAY  . [DISCONTINUED] albuterol (PROVENTIL HFA;VENTOLIN HFA) 108 (90 Base) MCG/ACT inhaler Inhale 2 puffs into the lungs every 4 (four) hours as needed for wheezing or shortness of breath  (cough, shortness of breath or wheezing.).  . [DISCONTINUED] omeprazole (PRILOSEC) 20 MG capsule TAKE 1 CAPSULE BY MOUTH EVERY DAY (Patient taking differently: Take 20 mg by mouth daily. )    No Known Allergies   Past Medical History:  Diagnosis Date  . Allergy   . Anxiety   . Asthma   . Depression   . GERD (gastroesophageal reflux disease)   . High blood pressure   . Obesity   . Pneumonia     Past Surgical History:  Procedure Laterality Date  . BREAST SURGERY     breast reduction 2012  . EYE SURGERY     strabismus  . TONSILECTOMY, ADENOIDECTOMY, BILATERAL MYRINGOTOMY AND TUBES      Family History  Problem Relation Age of Onset  . Diabetes Paternal Grandmother   . Depression Mother   . Hypertension Mother   . Hyperlipidemia Mother   . Arthritis Mother     Social History   Socioeconomic History  . Marital status: Single    Spouse name: Not on file  . Number of children: 0  . Years of education: Not on file  . Highest education level: 8th grade  Occupational History  . Occupation: unemployed  Social Needs  . Financial resource strain: Not on file  . Food insecurity:    Worry: Not on file    Inability: Not on file  . Transportation needs:    Medical: Not on file    Non-medical: Not on file  Tobacco Use  . Smoking status: Never Smoker  . Smokeless tobacco: Never Used  Substance and Sexual Activity  . Alcohol use: Yes    Comment: Rare  . Drug use: No  . Sexual activity: Never  Lifestyle  . Physical activity:    Days per week: Not on file    Minutes per session: Not on file  . Stress: Not on file  Relationships  . Social connections:    Talks on phone: Not on file    Gets together: Not on file    Attends religious service: Not on file    Active member of club or organization: Not on file    Attends meetings of clubs or organizations: Not on file    Relationship status: Not on file  . Intimate partner violence:    Fear of current or ex partner: Not  on file    Emotionally abused: Not on file    Physically abused: Not on file    Forced sexual activity: Not on file  Other Topics Concern  . Not on file  Social History Narrative   States her mother pulled her out of school in 8th or  9th grade as she was being bullied.     Ultimately obtained GED   She has never been employed.     Stays at home to take care of her mother for her DDD.    Review of Systems     Objective:   Physical Exam  Morbidly obese HEENT:  PERRL, EOMI, TMs pearly gray Nasal mucosa fine.  No posterior pharyngeal cobbling. Neck:  Supple, No adenopathy Chest: CTA CV:  RRR with normal S1 and S2, No S3, S4 or murmur.  Radial and DP pulses normal and equal Abd:  S, Mild RUQ tenderness.  No rebound or peritoneal signs,  No HSM or mass, + BS LE:  No edema     Assessment & Plan:  1.  Abdominal/RUQ complaints:  She is ready to go back to GI for further work up.  I do not see she has had a significant weight loss with these symptoms, but may not have all her weights. To take Omeprazole daily on empty stomach.    2.  Asthma and allergies:  Though do not see significant signs or symptoms currently, will restart meds, including Qvar, Montelukast, to consider Zyrtec or other antihistamine otc daily as well if symptoms not well controlled.  3.  Morbid obesity:  Goals for physical activity and sleep.  Discuss lifestyle changes regarding diet at next full visit  4.  Poor sleep hygiene and sleep:  Went over making goals for this in detail.    5.  Depression:  Bupropion XR 150 mg once daily, to increase to twice daily in 3 days.  Clonazepam twice daily as needed if increased anxiety sx Follow up in 1 week.   6.  BCP refilled

## 2018-05-21 ENCOUNTER — Ambulatory Visit: Payer: Self-pay | Admitting: Internal Medicine

## 2018-05-22 ENCOUNTER — Ambulatory Visit: Payer: Self-pay | Admitting: Internal Medicine

## 2018-05-28 ENCOUNTER — Ambulatory Visit: Payer: Medicaid Other | Admitting: Internal Medicine

## 2018-05-28 ENCOUNTER — Encounter: Payer: Self-pay | Admitting: Internal Medicine

## 2018-05-28 VITALS — BP 126/72 | HR 72 | Resp 14 | Ht 63.0 in | Wt 297.0 lb

## 2018-05-28 DIAGNOSIS — F339 Major depressive disorder, recurrent, unspecified: Secondary | ICD-10-CM

## 2018-05-28 MED ORDER — DULOXETINE HCL 30 MG PO CPEP: ORAL_CAPSULE | ORAL | 0 refills | Status: DC

## 2018-05-28 MED ORDER — DULOXETINE HCL 60 MG PO CPEP: ORAL_CAPSULE | ORAL | 11 refills | Status: DC

## 2018-05-28 NOTE — Progress Notes (Signed)
   Subjective:    Patient ID: Julie Fernandez, female    DOB: 28-Dec-1990, 28 y.o.   MRN: [id number]  HPI   1.  Depression:  Feels the Bupropion is causing bad headaches.  States she takes the medication and about 35 minutes later she develops a severe headache that generally lasts all day.   She gets up headache free and then the headache recurs after the morning dose.  She states she did not fill the Clonazepam.   The only other med she has taken was Fluoxetine.    Current Meds  Medication Sig  . albuterol (PROVENTIL HFA;VENTOLIN HFA) 108 (90 Base) MCG/ACT inhaler Inhale 2 puffs into the lungs every 4 (four) hours as needed for wheezing or shortness of breath (cough, shortness of breath or wheezing.).  Marland Kitchen clonazePAM (KLONOPIN) 1 MG tablet 1/2 to 1 tab by mouth twice daily as needed for anxiety  . norethindrone-ethinyl estradiol 1/35 (ALAYCEN 1/35) tablet Take 1 tablet by mouth daily.  Marland Kitchen omeprazole (PRILOSEC) 20 MG capsule 1 cap by mouth 1 hour before breakfast daily.  . [DISCONTINUED] buPROPion (WELLBUTRIN SR) 150 MG 12 hr tablet 1 tab by mouth daily in morning for 3 days, then 1 tab by mouth twice daily    No Known Allergies    Review of Systems     Objective:   Physical Exam NAD Appropriate       Assessment & Plan:  Depression:  Side effect of significant headache with Bupropion. Stop today  Wait one day, then start Cymbalta at 30 mg for 7 days, then increase to 60 mg daily.   Follow up in 1 week. Spoke with [facility] pharm, Dawn, to get her set up with samples

## 2018-05-28 NOTE — Patient Instructions (Signed)
Wait one day before starting Cymbalta No more bupropion today or going forward

## 2018-07-30 ENCOUNTER — Other Ambulatory Visit: Payer: Self-pay | Admitting: Internal Medicine

## 2018-07-30 DIAGNOSIS — N938 Other specified abnormal uterine and vaginal bleeding: Secondary | ICD-10-CM

## 2018-09-08 ENCOUNTER — Other Ambulatory Visit: Payer: Self-pay | Admitting: Internal Medicine

## 2018-09-08 DIAGNOSIS — N938 Other specified abnormal uterine and vaginal bleeding: Secondary | ICD-10-CM

## 2018-09-29 ENCOUNTER — Other Ambulatory Visit: Payer: Self-pay | Admitting: Internal Medicine

## 2018-09-29 DIAGNOSIS — N938 Other specified abnormal uterine and vaginal bleeding: Secondary | ICD-10-CM

## 2018-10-24 ENCOUNTER — Other Ambulatory Visit: Payer: Self-pay | Admitting: Internal Medicine

## 2018-10-24 DIAGNOSIS — N938 Other specified abnormal uterine and vaginal bleeding: Secondary | ICD-10-CM

## 2019-01-05 ENCOUNTER — Other Ambulatory Visit: Payer: Self-pay | Admitting: Internal Medicine

## 2019-01-05 DIAGNOSIS — N938 Other specified abnormal uterine and vaginal bleeding: Secondary | ICD-10-CM

## 2019-01-06 ENCOUNTER — Telehealth: Payer: Self-pay | Admitting: Internal Medicine

## 2019-01-06 NOTE — Telephone Encounter (Signed)
LVM for pt to return call -  CPE appointment schedule for 03/22/2019 at 8:30 am

## 2019-04-16 ENCOUNTER — Other Ambulatory Visit: Payer: Self-pay | Admitting: Internal Medicine

## 2019-04-16 DIAGNOSIS — N938 Other specified abnormal uterine and vaginal bleeding: Secondary | ICD-10-CM

## 2019-04-19 ENCOUNTER — Telehealth: Payer: Self-pay | Admitting: Internal Medicine

## 2019-04-19 MED ORDER — DULOXETINE HCL 60 MG PO CPEP: ORAL_CAPSULE | ORAL | 0 refills | Status: DC

## 2019-04-19 NOTE — Telephone Encounter (Signed)
Patient called requesting DULoxetine (CYMBALTA) 60 MG capsule to be called in at [facility].  Please advise.

## 2019-04-19 NOTE — Telephone Encounter (Signed)
RX sent to [facility]

## 2019-04-29 ENCOUNTER — Telehealth (INDEPENDENT_AMBULATORY_CARE_PROVIDER_SITE_OTHER): Payer: Medicaid Other | Admitting: Internal Medicine

## 2019-04-29 ENCOUNTER — Other Ambulatory Visit: Payer: Self-pay

## 2019-04-29 DIAGNOSIS — J45909 Unspecified asthma, uncomplicated: Secondary | ICD-10-CM

## 2019-04-29 DIAGNOSIS — F339 Major depressive disorder, recurrent, unspecified: Secondary | ICD-10-CM | POA: Insufficient documentation

## 2019-04-29 DIAGNOSIS — N938 Other specified abnormal uterine and vaginal bleeding: Secondary | ICD-10-CM

## 2019-04-29 MED ORDER — MONTELUKAST SODIUM 10 MG PO TABS: 10.0000 mg | ORAL_TABLET | Freq: Every day | ORAL | 11 refills | Status: AC

## 2019-04-29 MED ORDER — QVAR 40 MCG/ACT IN AERS: 2.0000 | INHALATION_SPRAY | Freq: Two times a day (BID) | RESPIRATORY_TRACT | 12 refills | Status: AC

## 2019-04-29 MED ORDER — DULOXETINE HCL 30 MG PO CPEP: ORAL_CAPSULE | ORAL | 0 refills | Status: AC

## 2019-04-29 MED ORDER — DULOXETINE HCL 60 MG PO CPEP: ORAL_CAPSULE | ORAL | 2 refills | Status: AC

## 2019-04-29 MED ORDER — ALBUTEROL SULFATE HFA 108 (90 BASE) MCG/ACT IN AERS: 2.0000 | INHALATION_SPRAY | Freq: Four times a day (QID) | RESPIRATORY_TRACT | 2 refills | Status: AC | PRN

## 2019-04-29 MED ORDER — ALBUTEROL SULFATE HFA 108 (90 BASE) MCG/ACT IN AERS: 2.0000 | INHALATION_SPRAY | Freq: Four times a day (QID) | RESPIRATORY_TRACT | 0 refills | Status: AC | PRN

## 2019-04-29 NOTE — Progress Notes (Signed)
    Subjective:    Patient ID: Julie Fernandez, female   DOB: December 16, 1990, 28 y.o.   MRN: [id number]   HPI   1.  Depression:  Did not have transportation end of November.  Increased anxiety.  Wanted to hurt herself.  Crying all the time. No plan to hurt herself. Her Cymbalta got refilled at [facility] for some reason, but patient had never started the medication back in January when initially prescribed.  She did not share with staff that she had never started previously.  So she started at the higher dose of 60 mg rather than 30 mg with a starter pack. Felt very sleepy and with no emotion, so stopped after a week (4 days ago).  Her depression and anxiety did improve, however.  She did feel sleepy about 3-4 hours after taking the Cymbalta.  2.  Dysfunctional Uterine Bleeding:  Takes Alaycen for this with good control of cycle/menses. She was supposed to have a follow up with gyn exam in the spring, but cancelled due to start of COVID-19 pandemic and never rescheduled. Was to have a CPE to refill after August, but not clear if no showed or what happened thereafter.  3.  Asthma:  Has not attempted to apply for any meds at [facility].  This is also where her Cymbalta was sent previously as well. Using her rescue inhaler 2-3 times weekly. Does not get outside and go for walks as would need inhaler more.  Current Meds  Medication Sig  . ALAYCEN 1/35 tablet TAKE 1 TABLET BY MOUTH EVERY DAY  . omeprazole (PRILOSEC) 20 MG capsule 1 cap by mouth 1 hour before breakfast daily.   No Known Allergies   Review of Systems    Objective:   There were no vitals taken for this visit.  Physical Exam NAD  Assessment & Plan   1.  Depression/anxiety:  To start Cymbalta at 30 mg daily for 7 days--new Rx to [facility] and [facility] coupons texted to patient for all meds today. Increase to 60 mg thereafter To take about 3-4 hours before a very regular bedtime Discussed good sleep hygiene. Virtual follow up in 2  weeks.  2.  Dysfunctional Uterine Bleeding:  Has 3 month Alaycen coverage.  Needs CPE with GU exam for another fill.  3.  Asthma: will send Rx for Montelukast and 1 time Albuterol fill to [facility] with coupons, but needs to get maintenance/corticosteroid inhaler at [facility].   She is not forthcoming on barriers other than transportation. Discussed at length the importance of follow up for safety reasons with meds.

## 2019-05-04 NOTE — Progress Notes (Signed)
Follow up appointments scheduled

## 2019-05-18 ENCOUNTER — Encounter: Payer: Self-pay | Admitting: Internal Medicine

## 2019-05-18 ENCOUNTER — Telehealth: Payer: Medicaid Other | Admitting: Internal Medicine

## 2019-06-01 ENCOUNTER — Telehealth: Payer: Self-pay | Admitting: Internal Medicine

## 2019-06-01 NOTE — Telephone Encounter (Signed)
Patient called requesting to be seen sooner than 07/28/19. Patient stated has been taking Alaycen regularly and has been having heavy periods x a month. Patient pain or fever.  Please advise

## 2019-06-14 NOTE — Telephone Encounter (Signed)
Patient placed on waiting list due to no available appointments. Will call if have a cancellation patient verbalized understanding.

## 2019-07-28 ENCOUNTER — Encounter: Payer: Medicaid Other | Admitting: Internal Medicine

## 2019-07-28 ENCOUNTER — Encounter: Payer: Self-pay | Admitting: Internal Medicine

## 2019-07-30 ENCOUNTER — Other Ambulatory Visit: Payer: Self-pay | Admitting: Internal Medicine

## 2019-07-30 DIAGNOSIS — N938 Other specified abnormal uterine and vaginal bleeding: Secondary | ICD-10-CM
